# Patient Record
Sex: Female | Born: 1965
Health system: Southern US, Community
[De-identification: ages and names within clinical notes are randomized; demographics above are authoritative.]

## PROBLEM LIST (undated history)

## (undated) DIAGNOSIS — I10 Essential (primary) hypertension: Secondary | ICD-10-CM

## (undated) DIAGNOSIS — I471 Supraventricular tachycardia, unspecified: Secondary | ICD-10-CM

## (undated) DIAGNOSIS — D649 Anemia, unspecified: Secondary | ICD-10-CM

## (undated) DIAGNOSIS — M8430XA Stress fracture, unspecified site, initial encounter for fracture: Secondary | ICD-10-CM

## (undated) DIAGNOSIS — M1990 Unspecified osteoarthritis, unspecified site: Secondary | ICD-10-CM

## (undated) DIAGNOSIS — E669 Obesity, unspecified: Secondary | ICD-10-CM

## (undated) DIAGNOSIS — N939 Abnormal uterine and vaginal bleeding, unspecified: Secondary | ICD-10-CM

## (undated) DIAGNOSIS — F17200 Nicotine dependence, unspecified, uncomplicated: Secondary | ICD-10-CM

## (undated) HISTORY — PX: KNEE SURGERY: SHX244

## (undated) HISTORY — DX: Anemia, unspecified: D64.9

## (undated) HISTORY — PX: OVARIAN CYST SURGERY: SHX726

## (undated) HISTORY — PX: LEFT OOPHORECTOMY: SHX1961

## (undated) HISTORY — PX: TUBAL LIGATION: SHX77

## (undated) HISTORY — DX: Nicotine dependence, unspecified, uncomplicated: F17.200

## (undated) HISTORY — DX: Supraventricular tachycardia, unspecified: I47.10

## (undated) HISTORY — PX: SALPINGECTOMY: SHX328

## (undated) HISTORY — DX: Stress fracture, unspecified site, initial encounter for fracture: M84.30XA

## (undated) HISTORY — PX: APPENDECTOMY: SHX54

## (undated) HISTORY — DX: Unspecified osteoarthritis, unspecified site: M19.90

## (undated) HISTORY — PX: OTHER SURGICAL HISTORY: SHX169

## (undated) HISTORY — DX: Essential (primary) hypertension: I10

## (undated) HISTORY — DX: Abnormal uterine and vaginal bleeding, unspecified: N93.9

## (undated) HISTORY — PX: HYSTEROSCOPY: SHX211

## (undated) HISTORY — DX: Obesity, unspecified: E66.9

## (undated) HISTORY — PX: DERMOID CYST  EXCISION: SHX1452

## (undated) HISTORY — DX: Supraventricular tachycardia: I47.1

---

## 1998-02-16 ENCOUNTER — Ambulatory Visit (HOSPITAL_COMMUNITY): Admission: RE | Admit: 1998-02-16 | Discharge: 1998-02-16 | Payer: Self-pay | Admitting: Obstetrics and Gynecology

## 2001-11-04 ENCOUNTER — Ambulatory Visit (HOSPITAL_BASED_OUTPATIENT_CLINIC_OR_DEPARTMENT_OTHER): Admission: RE | Admit: 2001-11-04 | Discharge: 2001-11-04 | Payer: Self-pay | Admitting: Obstetrics and Gynecology

## 2003-11-28 ENCOUNTER — Ambulatory Visit (HOSPITAL_BASED_OUTPATIENT_CLINIC_OR_DEPARTMENT_OTHER): Admission: RE | Admit: 2003-11-28 | Discharge: 2003-11-28 | Payer: Self-pay | Admitting: Surgery

## 2003-11-28 ENCOUNTER — Ambulatory Visit (HOSPITAL_COMMUNITY): Admission: RE | Admit: 2003-11-28 | Discharge: 2003-11-28 | Payer: Self-pay | Admitting: Surgery

## 2003-11-28 ENCOUNTER — Encounter (INDEPENDENT_AMBULATORY_CARE_PROVIDER_SITE_OTHER): Payer: Self-pay | Admitting: *Deleted

## 2004-03-16 ENCOUNTER — Emergency Department (HOSPITAL_COMMUNITY): Admission: EM | Admit: 2004-03-16 | Discharge: 2004-03-16 | Payer: Self-pay | Admitting: Family Medicine

## 2004-06-26 ENCOUNTER — Ambulatory Visit: Payer: Self-pay | Admitting: Family Medicine

## 2004-12-27 ENCOUNTER — Ambulatory Visit: Payer: Self-pay | Admitting: Family Medicine

## 2005-03-24 ENCOUNTER — Ambulatory Visit: Payer: Self-pay | Admitting: Family Medicine

## 2005-04-08 ENCOUNTER — Ambulatory Visit: Payer: Self-pay | Admitting: Family Medicine

## 2005-06-10 ENCOUNTER — Ambulatory Visit: Payer: Self-pay | Admitting: Family Medicine

## 2005-08-19 ENCOUNTER — Ambulatory Visit: Payer: Self-pay | Admitting: Internal Medicine

## 2005-08-26 ENCOUNTER — Ambulatory Visit (HOSPITAL_COMMUNITY): Admission: RE | Admit: 2005-08-26 | Discharge: 2005-08-26 | Payer: Self-pay | Admitting: Family Medicine

## 2005-08-27 ENCOUNTER — Ambulatory Visit: Payer: Self-pay | Admitting: Internal Medicine

## 2005-09-22 ENCOUNTER — Ambulatory Visit: Payer: Self-pay | Admitting: Internal Medicine

## 2006-05-04 ENCOUNTER — Ambulatory Visit: Payer: Self-pay | Admitting: Family Medicine

## 2006-12-01 ENCOUNTER — Ambulatory Visit: Payer: Self-pay | Admitting: Family Medicine

## 2007-03-01 ENCOUNTER — Telehealth (INDEPENDENT_AMBULATORY_CARE_PROVIDER_SITE_OTHER): Payer: Self-pay | Admitting: *Deleted

## 2007-03-02 ENCOUNTER — Emergency Department (HOSPITAL_COMMUNITY): Admission: EM | Admit: 2007-03-02 | Discharge: 2007-03-02 | Payer: Self-pay | Admitting: Emergency Medicine

## 2007-05-24 ENCOUNTER — Encounter (INDEPENDENT_AMBULATORY_CARE_PROVIDER_SITE_OTHER): Payer: Self-pay | Admitting: Internal Medicine

## 2007-08-25 ENCOUNTER — Ambulatory Visit: Payer: Self-pay | Admitting: Family Medicine

## 2007-08-25 DIAGNOSIS — F17200 Nicotine dependence, unspecified, uncomplicated: Secondary | ICD-10-CM | POA: Insufficient documentation

## 2008-01-27 ENCOUNTER — Encounter: Admission: RE | Admit: 2008-01-27 | Discharge: 2008-01-27 | Payer: Self-pay | Admitting: Orthopaedic Surgery

## 2008-04-26 ENCOUNTER — Encounter: Payer: Self-pay | Admitting: Family Medicine

## 2008-08-15 ENCOUNTER — Encounter (INDEPENDENT_AMBULATORY_CARE_PROVIDER_SITE_OTHER): Payer: Self-pay | Admitting: Internal Medicine

## 2008-08-25 ENCOUNTER — Encounter (INDEPENDENT_AMBULATORY_CARE_PROVIDER_SITE_OTHER): Payer: Self-pay | Admitting: *Deleted

## 2008-09-25 ENCOUNTER — Ambulatory Visit: Payer: Self-pay | Admitting: Family Medicine

## 2008-09-25 DIAGNOSIS — E669 Obesity, unspecified: Secondary | ICD-10-CM | POA: Insufficient documentation

## 2008-09-25 DIAGNOSIS — D509 Iron deficiency anemia, unspecified: Secondary | ICD-10-CM | POA: Insufficient documentation

## 2008-09-25 DIAGNOSIS — E559 Vitamin D deficiency, unspecified: Secondary | ICD-10-CM | POA: Insufficient documentation

## 2008-10-30 ENCOUNTER — Ambulatory Visit: Payer: Self-pay | Admitting: Family Medicine

## 2008-10-30 DIAGNOSIS — J069 Acute upper respiratory infection, unspecified: Secondary | ICD-10-CM | POA: Insufficient documentation

## 2008-11-20 ENCOUNTER — Ambulatory Visit: Payer: Self-pay | Admitting: Family Medicine

## 2008-11-28 LAB — CONVERTED CEMR LAB
Basophils Relative: 0.7 % (ref 0.0–3.0)
Eosinophils Absolute: 0.2 10*3/uL (ref 0.0–0.7)
HCT: 37.8 % (ref 36.0–46.0)
Hemoglobin: 12.9 g/dL (ref 12.0–15.0)
Lymphs Abs: 3.1 10*3/uL (ref 0.7–4.0)
MCHC: 34.1 g/dL (ref 30.0–36.0)
MCV: 89.7 fL (ref 78.0–100.0)
Monocytes Absolute: 0.5 10*3/uL (ref 0.1–1.0)
Neutro Abs: 5.8 10*3/uL (ref 1.4–7.7)
RBC: 4.21 M/uL (ref 3.87–5.11)

## 2009-04-05 ENCOUNTER — Ambulatory Visit: Payer: Self-pay | Admitting: Family Medicine

## 2009-04-05 DIAGNOSIS — H60399 Other infective otitis externa, unspecified ear: Secondary | ICD-10-CM | POA: Insufficient documentation

## 2009-04-08 ENCOUNTER — Telehealth: Payer: Self-pay | Admitting: Internal Medicine

## 2009-04-11 ENCOUNTER — Ambulatory Visit: Payer: Self-pay | Admitting: Family Medicine

## 2009-04-11 DIAGNOSIS — H6590 Unspecified nonsuppurative otitis media, unspecified ear: Secondary | ICD-10-CM | POA: Insufficient documentation

## 2009-05-15 ENCOUNTER — Ambulatory Visit: Payer: Self-pay | Admitting: Family Medicine

## 2009-05-15 DIAGNOSIS — F4321 Adjustment disorder with depressed mood: Secondary | ICD-10-CM | POA: Insufficient documentation

## 2009-06-12 ENCOUNTER — Telehealth (INDEPENDENT_AMBULATORY_CARE_PROVIDER_SITE_OTHER): Payer: Self-pay | Admitting: Internal Medicine

## 2009-08-23 ENCOUNTER — Ambulatory Visit: Payer: Self-pay | Admitting: Family Medicine

## 2009-09-10 ENCOUNTER — Encounter: Payer: Self-pay | Admitting: Family Medicine

## 2009-09-18 ENCOUNTER — Encounter: Payer: Self-pay | Admitting: Family Medicine

## 2009-09-18 LAB — HM MAMMOGRAPHY

## 2009-09-19 ENCOUNTER — Ambulatory Visit: Payer: Self-pay | Admitting: Family Medicine

## 2009-09-19 LAB — CONVERTED CEMR LAB
ALT: <8 units/L (ref 0–35)
CO2: 23 meq/L (ref 19–32)
Cholesterol: 178 mg/dL (ref 0–200)
Creatinine, Ser: 0.71 mg/dL (ref 0.40–1.20)
HCT: 39.5 % (ref 36.0–46.0)
MCHC: 32.2 g/dL (ref 30.0–36.0)
MCV: 87.2 fL (ref 78.0–100.0)
Platelets: 428 10*3/uL — ABNORMAL HIGH (ref 150–400)
RDW: 13.8 % (ref 11.5–15.5)
Total Bilirubin: 0.3 mg/dL (ref 0.3–1.2)
Total CHOL/HDL Ratio: 4
Triglycerides: 86 mg/dL (ref <150)
VLDL: 17 mg/dL (ref 0–40)
Vit D, 25-Hydroxy: 27 ng/mL — ABNORMAL LOW (ref 30–89)
WBC: 8.1 10*3/uL (ref 4.0–10.5)

## 2009-09-21 ENCOUNTER — Ambulatory Visit: Payer: Self-pay | Admitting: Internal Medicine

## 2009-09-27 ENCOUNTER — Ambulatory Visit (HOSPITAL_COMMUNITY): Admission: RE | Admit: 2009-09-27 | Discharge: 2009-09-27 | Payer: Self-pay | Admitting: Obstetrics & Gynecology

## 2009-10-11 ENCOUNTER — Ambulatory Visit: Payer: Self-pay | Admitting: Family Medicine

## 2009-10-18 ENCOUNTER — Ambulatory Visit: Payer: Self-pay | Admitting: Family Medicine

## 2009-10-30 ENCOUNTER — Ambulatory Visit: Payer: Self-pay | Admitting: Family Medicine

## 2010-01-23 ENCOUNTER — Ambulatory Visit: Payer: Self-pay | Admitting: Family Medicine

## 2010-02-08 ENCOUNTER — Ambulatory Visit (HOSPITAL_COMMUNITY): Admission: RE | Admit: 2010-02-08 | Discharge: 2010-02-08 | Payer: Self-pay | Admitting: Unknown Physician Specialty

## 2010-02-20 ENCOUNTER — Ambulatory Visit: Payer: Self-pay | Admitting: Family Medicine

## 2010-03-04 ENCOUNTER — Ambulatory Visit: Payer: Self-pay | Admitting: Family Medicine

## 2010-03-04 DIAGNOSIS — I10 Essential (primary) hypertension: Secondary | ICD-10-CM | POA: Insufficient documentation

## 2010-03-04 DIAGNOSIS — R252 Cramp and spasm: Secondary | ICD-10-CM | POA: Insufficient documentation

## 2010-03-06 ENCOUNTER — Telehealth (INDEPENDENT_AMBULATORY_CARE_PROVIDER_SITE_OTHER): Payer: Self-pay | Admitting: *Deleted

## 2010-03-06 LAB — CONVERTED CEMR LAB
CO2: 30 meq/L (ref 19–32)
Calcium: 10 mg/dL (ref 8.4–10.5)
Creatinine, Ser: 0.7 mg/dL (ref 0.4–1.2)
Glucose, Bld: 102 mg/dL — ABNORMAL HIGH (ref 70–99)

## 2010-03-12 ENCOUNTER — Telehealth: Payer: Self-pay | Admitting: Family Medicine

## 2010-03-14 ENCOUNTER — Ambulatory Visit: Payer: Self-pay | Admitting: Family Medicine

## 2010-04-08 ENCOUNTER — Ambulatory Visit: Payer: Self-pay | Admitting: Family Medicine

## 2010-04-08 ENCOUNTER — Ambulatory Visit (HOSPITAL_COMMUNITY): Admission: RE | Admit: 2010-04-08 | Discharge: 2010-04-08 | Payer: Self-pay | Admitting: Family Medicine

## 2010-04-30 ENCOUNTER — Ambulatory Visit: Payer: Self-pay | Admitting: Family Medicine

## 2010-08-29 ENCOUNTER — Ambulatory Visit
Admission: RE | Admit: 2010-08-29 | Discharge: 2010-08-29 | Payer: Self-pay | Source: Home / Self Care | Attending: Family Medicine | Admitting: Family Medicine

## 2010-08-29 ENCOUNTER — Other Ambulatory Visit: Payer: Self-pay | Admitting: Family Medicine

## 2010-08-29 LAB — LIPID PANEL
Cholesterol: 173 mg/dL (ref 0–200)
HDL: 34.1 mg/dL — ABNORMAL LOW (ref >39.00)
LDL Cholesterol: 117 mg/dL — ABNORMAL HIGH (ref 0–99)
Total CHOL/HDL Ratio: 5
Triglycerides: 109 mg/dL (ref 0.0–149.0)
VLDL: 21.8 mg/dL (ref 0.0–40.0)

## 2010-08-29 LAB — BASIC METABOLIC PANEL
BUN: 9 mg/dL (ref 6–23)
CO2: 27 mEq/L (ref 19–32)
Calcium: 9.4 mg/dL (ref 8.4–10.5)
Chloride: 100 mEq/L (ref 96–112)
Creatinine, Ser: 0.8 mg/dL (ref 0.4–1.2)
GFR: 86.79 mL/min (ref >60.00)
Glucose, Bld: 86 mg/dL (ref 70–99)
Potassium: 4.2 mEq/L (ref 3.5–5.1)
Sodium: 133 mEq/L — ABNORMAL LOW (ref 135–145)

## 2010-09-12 NOTE — Assessment & Plan Note (Signed)
Summary: PAIN IN LEFT LEG   Vital Signs:  Patient profile:   45 year old female Height:      66 inches Weight:      216 pounds BMI:     34.99 Temp:     97.6 degrees F oral Pulse rate:   76 / minute Pulse rhythm:   regular BP sitting:   140 / 100  (left arm) Cuff size:   large  Vitals Entered By: Linde Gillis CMA Duncan Dull) (March 04, 2010 4:03 PM) CC: left leg cramping   History of Present Illness: 45 yo here for left leg cramping x 2 days.  Started in her left thigh, now intermittent in her left calf. No tenderness or warmth in her leg.  Has been out in the sun and heat more but has an indoor desk job during the day.  Elevated blood pressure- has been slowly increasing over last several months.  Has gained weight and had increased stress since husband was laid off.  No HA, blurred vision, or CP. Never been treated for HTN in past. Both mom and dad have HTN.  Current Medications (verified): 1)  Hydrochlorothiazide 12.5 Mg  Tabs (Hydrochlorothiazide) .... Take 1 Tab  By Mouth Every Morning  Allergies: 1)  ! Codeine 2)  ! Latex Exam Gloves (Disposable Gloves)  Past History:  Past Medical History: Last updated: 10/30/2008 Obesity Anemia Stress Fractures Vit D deficiency SVT  smoker  Review of Systems      See HPI General:  Denies malaise. Eyes:  Denies blurring. CV:  Denies chest pain or discomfort. Resp:  Denies shortness of breath. MS:  Denies muscle weakness.  Physical Exam  General:  alert.  NAD Extremities:  left lower extremity: no palpable cord, no erythema or increased warmth.  FROM. Psych:  normal affect, talkative and pleasant    Impression & Recommendations:  Problem # 1:  HYPERTENSION (ICD-401.9) Assessment New Will start HCTZ 12.5 mg and have her follow up in one month with myself or her PMD. aDvised NOT starting it until we call with her BMET results. Her updated medication list for this problem includes:    Hydrochlorothiazide 12.5 Mg Tabs  (Hydrochlorothiazide) .Marland Kitchen... Take 1 tab  by mouth every morning  Orders: Venipuncture (81191) TLB-BMP (Basic Metabolic Panel-BMET) (80048-METABOL)  Problem # 2:  LEG CRAMPS (ICD-729.82) Assessment: New Appears to be slowly improving, likely due to electrolyte imbalances from dehydration.  Discussed warning signs of DVT. Placing on a diuretic for HTN so discussed importance of staying hydrated and eating Potassium rich foods.  Complete Medication List: 1)  Hydrochlorothiazide 12.5 Mg Tabs (Hydrochlorothiazide) .... Take 1 tab  by mouth every morning  Patient Instructions: 1)  Please follow up with me in one month. 2)  Do not start blood pressure medication until we call you. 3)  Let me know if cramping worsens or changes. Prescriptions: HYDROCHLOROTHIAZIDE 12.5 MG  TABS (HYDROCHLOROTHIAZIDE) Take 1 tab  by mouth every morning  #90 x 0   Entered and Authorized by:   Ruthe Mannan MD   Signed by:   Ruthe Mannan MD on 03/04/2010   Method used:   [facility]  [address] [id number]       [address]       [place], [place]  [address]       Ph: [phone]       Fax:  [phone]   RxID:   [id number]   Prior Medications (reviewed today): None Current Allergies (reviewed today): ! CODEINE ! LATEX EXAM GLOVES (DISPOSABLE GLOVES)

## 2010-09-12 NOTE — Letter (Signed)
Summary: Results Follow up Letter  [place] [facility]  [address] [place], [place] [address]   Phone: [phone]  Fax: [phone]    09/18/2009 MRN: [id number]  [name] [address] [place], [place]  [address]  Dear [name],  The following are the results of your recent test(s):  Test         Result    Pap Smear:        Normal _____  Not Normal _____ Comments: ______________________________________________________ Cholesterol: LDL(Bad cholesterol):         Your goal is less than:         HDL (Good cholesterol):       Your goal is more than: Comments:  ______________________________________________________ Mammogram:        Normal __X___  Not Normal _____ Comments: Please repeat in one year.    ___________________________________________________________________ Hemoccult:        Normal _____  Not normal _______ Comments:    _____________________________________________________________________ Other Tests:    We routinely do not discuss normal results over the telephone.  If you desire a copy of the results, or you have any questions about this information we can discuss them at your next office visit.   Sincerely,        Hannah Beat, MD

## 2010-09-12 NOTE — Progress Notes (Signed)
Summary: heart rate is up  Phone Note Call from Patient Call back at [phone]   Caller: Patient Call For: Ruthe Mannan MD Summary of Call: Pt states she started hctz last week.  She thinks this is helping her BP but her heart rate has increased.  Her pulse is usually in the 80's but now in the 90's, was 110 last night, at rest. Please advise. Initial call taken by: Lowella Petties CMA,  March 12, 2010 9:30 AM  Follow-up for Phone Call        She's on a low dose and the only reason it should elevate her BP is if she is dehydrate or BP is too low.  Please have her make appt to be evaluated. Ruthe Mannan MD  March 12, 2010 9:33 AM  Patient advised as instructed, appt made for 03/15/2010 at 2:45.  Advised patient that is she developled SOB or her symptoms worsen go to the ER.  Follow-up by: Linde Gillis CMA Duncan Dull),  March 12, 2010 9:58 AM

## 2010-09-12 NOTE — Assessment & Plan Note (Signed)
Summary: BLOOD PRESSURE CONCERNS/NT   Vital Signs:  Patient profile:   45 year old female Height:      66 inches Weight:      216.38 pounds BMI:     35.05 Temp:     97.9 degrees F oral Pulse rate:   76 / minute Pulse rhythm:   regular BP sitting:   126 / 82  (left arm) Cuff size:   large  Vitals Entered By: Linde Gillis CMA Duncan Dull) (March 14, 2010 12:19 PM) CC: blood pressure concerns   History of Present Illness: Pt here for follow up BP, blood pressure concerns.  Started HCTZ 12.5 mg last week.  No side effects [facility] and her pulse was in the 90s and 100s.  Did not feel dizzy or like she was having palpitations.  No CP or SOB.   Bought a home BP meter and brings it in with her today.    BP on our machine in office is 126/82, pulse 76 Her home metere: 137/95, P 73  Current Medications (verified): 1)  Hydrochlorothiazide 12.5 Mg  Tabs (Hydrochlorothiazide) .... Take 1 Tab  By Mouth Every Morning 2)  Vitamin D (Ergocalciferol) 50000 Unit Caps (Ergocalciferol) .... Take One Tablet By Mouth Once A Week  Allergies: 1)  ! Codeine 2)  ! Latex Exam Gloves (Disposable Gloves)  Review of Systems      See HPI General:  Denies malaise. Eyes:  Denies blurring. CV:  Denies chest pain or discomfort. Resp:  Denies shortness of breath.  Physical Exam  General:  alert.  NAD VSS Lungs:  Normal respiratory effort, chest expands symmetrically. Lungs are clear to auscultation, no crackles or wheezes. Heart:  Normal rate and regular rhythm. S1 and S2 normal without gallop, murmur, click, rub or other extra sounds. Extremities:  no edema Psych:  normal affect, talkative and pleasant    Impression & Recommendations:  Problem # 1:  HYPERTENSION (ICD-401.9) Assessment Improved Reassurance provided, [facility] machine was likely inaccurate. Continue HCTZ at current dose, follow up at previously scheduled appt. Her updated medication list for this problem  includes:    Hydrochlorothiazide 12.5 Mg Tabs (Hydrochlorothiazide) .Marland Kitchen... Take 1 tab  by mouth every morning  Complete Medication List: 1)  Hydrochlorothiazide 12.5 Mg Tabs (Hydrochlorothiazide) .... Take 1 tab  by mouth every morning 2)  Vitamin D (ergocalciferol) 50000 Unit Caps (Ergocalciferol) .... Take one tablet by mouth once a week  Current Allergies (reviewed today): ! CODEINE ! LATEX EXAM GLOVES (DISPOSABLE GLOVES)

## 2010-09-12 NOTE — Assessment & Plan Note (Signed)
Summary: 11:30 CONGESTION,COUGH/CLE   Vital Signs:  Patient profile:   45 year old female Height:      66 inches Weight:      216.2 pounds BMI:     35.02 Temp:     98.0 degrees F oral Pulse rate:   80 / minute Pulse rhythm:   regular BP sitting:   110 / 80  (left arm) Cuff size:   regular  Vitals Entered By: Benny Lennert CMA Duncan Dull) (August 23, 2009 11:52 AM)  History of Present Illness: Chief complaint congestion and cough since saturday  44 year old female:    Acute Visit History:      The patient complains of cough, nasal discharge, sinus problems, and sore throat.  These symptoms began 5 days ago.  She denies abdominal pain, chest pain, constipation, earache, fever, headache, musculoskeletal symptoms, and nausea.        There is no history of wheezing, sleep interference, shortness of breath, respiratory retractions, tachypnea, cyanosis, or interference with oral intake associated with her cough.        'Cold' or URI symptoms have been present with the sore throat.  There is no history of dysphagia, drooling, or recent exposure to strep.        She complains of nasal congestion and purulent drainage.        Allergies: 1)  ! Codeine 2)  ! Latex Exam Gloves (Disposable Gloves)  Past History:  Past medical, surgical, family and social histories (including risk factors) reviewed, and no changes noted (except as noted below).  Past Medical History: Reviewed history from 10/30/2008 and no changes required. Obesity Anemia Stress Fractures Vit D deficiency SVT  smoker  Family History: Reviewed history and no changes required.  Social History: Reviewed history and no changes required.  Review of Systems       REVIEW OF SYSTEMS GEN: Acute illness details above. CV: No chest pain or SOB GI: No noted N or V Otherwise, pertinent positives and negatives are noted in the HPI.   Physical Exam  Additional Exam:  GEN: WDWN, NAD; alert,appropriate and cooperative  throughout exam HEENT: Normocephalic and atraumatic. Throat clear, w/o exudate, no LAD, R TM clear, L TM - good landmarks, No fluid present. rhinnorhea.  Left frontal and maxillary sinuses: NT Right frontal and maxillary sinuses: NT NECK: No ant or post LAD CV: RRR, No M/G/R PULM: no resp distress, no accessory muscles.  No retractions. no w/c/r ABD: S,NT,ND,+BS, No HSM EXTR: no c/c/e PSYCH: full affect, pleasant, conversant    Impression & Recommendations:  Problem # 1:  URI (ICD-465.9) I discussed upper respiratory tract infections with the patient and explained viral infections in general.  Recommended sleep, rest, no tobacco, no alcohol, no other drugs. Symptomatic care with pushing fluids. Symptomatic care with over-the-counter expectorant, such as Mucinex DM or Robitussin-DM, including a cough suppressant. Oral acetaminophen or NSAIDs as tolerated for body aches, chills, fevers.  follow-up if acutely worsens   We discussed and since Billie's retirement, she will follow-up with me as PCP.  Her updated medication list for this problem includes:    Benadryl 25 Mg Caps (Diphenhydramine hcl) ..... One at bedtime  Complete Medication List: 1)  Bl Multiple Vitamins Tabs (Multiple vitamins-minerals) .... Take 1 tablet by mouth once a day 2)  Vitamin D3 2000 Unit Caps (Cholecalciferol) .... Otc as directed. 3)  Calcium-magnesium 500-250 Mg Tabs (Calcium-magnesium) .... Otc as directed. 4)  Vitamin C 1000 Mg Tabs (Ascorbic acid) .Marland KitchenMarland KitchenMarland Kitchen  Otc as directed. 5)  Benadryl 25 Mg Caps (Diphenhydramine hcl) .... One at bedtime 6)  Fish Oil Oil (Fish oil) .... Otc as directed. 7)  Ferritin 5mg   .... Otc as directed.  Patient Instructions: 1)  AFRIN nasal spray 2)  Sudafed as needed  3)  Vit d - 2000 units a day  Current Allergies (reviewed today): ! CODEINE ! LATEX EXAM GLOVES (DISPOSABLE GLOVES)

## 2010-09-12 NOTE — Assessment & Plan Note (Signed)
Summary: EARACHE   Vital Signs:  Patient profile:   45 year old female Height:      66 inches Weight:      218.8 pounds BMI:     35.44 Temp:     98.2 degrees F oral Pulse rate:   80 / minute Pulse rhythm:   regular BP sitting:   130 / 84  (left arm) Cuff size:   regular  Vitals Entered By: Mervin Hack CMA Duncan Dull) (September 21, 2009 4:59 PM) CC: left ear pain   History of Present Illness: got over the cold from last month  Left ear pain since about 7 days ago thought it might be swimmer's ear and tried left over antibiotic/steroid drop didn't help  No swimmer no trauma or foreign body  Slight dry allergy cough not sick no sig nasal symptoms  No fever  Allergies: 1)  ! Codeine 2)  ! Latex Exam Gloves (Disposable Gloves)  Past History:  Past medical, surgical, family and social histories (including risk factors) reviewed for relevance to current acute and chronic problems.  Past Medical History: Reviewed history from 10/30/2008 and no changes required. Obesity Anemia Stress Fractures Vit D deficiency SVT  smoker  Family History: Reviewed history and no changes required.  Social History: Reviewed history and no changes required.  Review of Systems       no vertigo notes some tinnitus hearing is okay  Physical Exam  General:  alert.  NAD Ears:  sig left pinna tenderness Inflammation noted inside of pinna and sig in canal deep cerumen but TM doesn't appear involved  right ear normal Nose:  no sig inflammation Mouth:  no erythema and no exudates.     Impression & Recommendations:  Problem # 1:  OTITIS EXTERNA (ICD-380.10) Assessment Comment Only  no better with 2 days of cortisporin otic drops  will try oral cipro cipro/HC  drops ibuprofen for pain  Her updated medication list for this problem includes:    Cipro Hc 0.2-1 % Susp (Ciprofloxacin-hydrocortisone) .Marland KitchenMarland KitchenMarland KitchenMarland Kitchen 4 drops in left ear 4 times daily for 5 days  Complete Medication  List: 1)  Cipro Hc 0.2-1 % Susp (Ciprofloxacin-hydrocortisone) .... 4 drops in left ear 4 times daily for 5 days 2)  Ciprofloxacin Hcl 250 Mg Tabs (Ciprofloxacin hcl) .Marland Kitchen.. 1 tab by mouth two times a day for outer ear infeciton  Patient Instructions: 1)  Please schedule a follow-up appointment as needed .  Prescriptions: CIPROFLOXACIN HCL 250 MG TABS (CIPROFLOXACIN HCL) 1 tab by mouth two times a day for outer ear infeciton  #14 x 0   Entered and Authorized by:   Cindee Salt MD   Signed by:   Cindee Salt MD on 09/21/2009   Method used:   [facility]  [address] [phone]       [address]       [place], [place]  [address]       Ph: [phone]       Fax: [phone]   RxID:   [id number] CIPRO HC 0.2-1 % SUSP (CIPROFLOXACIN-HYDROCORTISONE) 4 drops in left ear 4 times daily for 5 days  #1 bottle x 1   Entered and Authorized by:   Cindee Salt MD   Signed by:   Cindee Salt MD on 09/21/2009   Method used:   [facility]  [address] [id number]       [address]       [place], [place]  [address]       Ph: [phone]       Fax: [phone]   RxID:   [id number]   Prior Medications (reviewed today): None Current Allergies (reviewed today): ! CODEINE ! LATEX EXAM GLOVES (DISPOSABLE GLOVES)

## 2010-09-12 NOTE — Progress Notes (Signed)
Summary: Transfer care to Dr. Dayton Martes  Phone Note Call from Patient Call back at Work Phone [phone]   Caller: Patient Call For: Hannah Beat MD Summary of Call: Patient saw Dr. Dayton Martes on 03/04/2010 for foot pain and would like to transfer her care to Dr. Dayton Martes.  Patient is aware that both Dr. Patsy Lager and Dr. Dayton Martes have to agree that this would be ok.  Please advise. Initial call taken by: Linde Gillis CMA Duncan Dull),  March 06, 2010 9:00 AM  Follow-up for Phone Call        I believe this is one of Billie's patients that I only saw once. Perfectly fine. Follow-up by: Hannah Beat MD,  March 06, 2010 9:13 AM  Additional Follow-up for Phone Call Additional follow up Details #1::        Ok with me. Ruthe Mannan MD  March 06, 2010 9:14 AM     Additional Follow-up for Phone Call Additional follow up Details #2::    I notified pt. she can switch to Dr.Aron. Follow-up by: Beau Fanny,  March 06, 2010 9:25 AM

## 2010-09-12 NOTE — Letter (Signed)
Summary: Out of Work  [facility]  [address] [place], [place] [address]   Phone: [phone]  Fax: [phone]    August 23, 2009   Employee:  HADJA HARRAL    To Whom It May Concern:   For Medical reasons, please excuse the above named employee from work for the following dates:  Start:   08/22/2009  End:   08/24/2009  If you need additional information, please feel free to contact our office.         Sincerely,    Hannah Beat MD

## 2010-09-12 NOTE — Assessment & Plan Note (Signed)
Summary: F/U FOR FLUID PILLS / LFW   Vital Signs:  Patient profile:   45 year old female Height:      66 inches Weight:      217 pounds BMI:     35.15 Temp:     97.9 degrees F oral Pulse rate:   86 / minute Pulse rhythm:   regular BP sitting:   120 / 82  (right arm) Cuff size:   large  Vitals Entered By: Linde Gillis CMA Duncan Dull) (August 29, 2010 8:54 AM) CC: follow up from being on fluid pills   History of Present Illness: Pt here for follow up BP, blood pressure concerns.  Started HCTZ 12.5 mg in August.  No side effects.  No CP, SOB, blurred vision.    No Le edema. Has not had labs checked since last year.  Current Medications (verified): 1)  Hydrochlorothiazide 12.5 Mg  Tabs (Hydrochlorothiazide) .... Take 1 Tab  By Mouth Every Morning  Allergies: 1)  ! Codeine 2)  ! Latex Exam Gloves (Disposable Gloves)  Past History:  Past Medical History: Last updated: 10/30/2008 Obesity Anemia Stress Fractures Vit D deficiency SVT  smoker  Review of Systems      See HPI General:  Denies malaise. Eyes:  Denies blurring. CV:  Denies chest pain or discomfort and shortness of breath with exertion. Resp:  Denies shortness of breath.  Physical Exam  General:  alert.  NAD VSS Lungs:  Normal respiratory effort, chest expands symmetrically. Lungs are clear to auscultation, no crackles or wheezes. Heart:  Normal rate and regular rhythm. S1 and S2 normal without gallop, murmur, click, rub or other extra sounds. Extremities:  no edema Neurologic:  alert & oriented X3 and gait normal.   Skin:  Intact without suspicious lesions or rashes Psych:  normal affect, talkative and pleasant    Impression & Recommendations:  Problem # 1:  HYPERTENSION (ICD-401.9) Assessment Unchanged Stable.  Well controlled on low dose HCTZ. Recheck BMET today. Her updated medication list for this problem includes:    Hydrochlorothiazide 12.5 Mg Tabs (Hydrochlorothiazide) .Marland Kitchen... Take 1 tab  by  mouth every morning  Orders: Venipuncture (14782) TLB-BMP (Basic Metabolic Panel-BMET) (80048-METABOL)  Complete Medication List: 1)  Hydrochlorothiazide 12.5 Mg Tabs (Hydrochlorothiazide) .... Take 1 tab  by mouth every morning  Other Orders: TLB-Lipid Panel (80061-LIPID) Prescriptions: HYDROCHLOROTHIAZIDE 12.5 MG  TABS (HYDROCHLOROTHIAZIDE) Take 1 tab  by mouth every morning  #90 Capsule x 3   Entered and Authorized by:   Ruthe Mannan MD   Signed by:   Ruthe Mannan MD on 08/29/2010   Method used:   [facility]  [address] [id number]       [address]       [place], [place]  [address]       Ph: [phone]       Fax: [phone]   RxID:   [id number]    Orders Added: 1)  Venipuncture [59563] 2)  TLB-Lipid Panel [80061-LIPID] 3)  TLB-BMP (Basic Metabolic Panel-BMET) [80048-METABOL] 4)  Est. Patient Level III [87564]    Current Allergies (reviewed today): ! CODEINE ! LATEX EXAM GLOVES (DISPOSABLE GLOVES)

## 2010-09-12 NOTE — Miscellaneous (Signed)
Summary: Clinical update, mammogram  Clinical Lists Changes  Observations: Added new observation of MAMMO DUE: 09/10/2010 (09/18/2009 14:14) Added new observation of MAMMOGRAM: 09/10/2009 (09/18/2009 14:14)

## 2010-10-25 LAB — CBC
Platelets: 379 10*3/uL (ref 150–400)
RDW: 15.2 % (ref 11.5–15.5)
WBC: 10.5 10*3/uL (ref 4.0–10.5)

## 2010-10-25 LAB — BASIC METABOLIC PANEL
CO2: 29 mEq/L (ref 19–32)
Calcium: 9.8 mg/dL (ref 8.4–10.5)
Creatinine, Ser: 0.71 mg/dL (ref 0.4–1.2)
GFR calc Af Amer: >60 mL/min (ref >60)

## 2010-10-25 LAB — SURGICAL PCR SCREEN: MRSA, PCR: NEGATIVE

## 2010-10-25 LAB — PREGNANCY, URINE: Preg Test, Ur: NEGATIVE

## 2010-12-18 ENCOUNTER — Ambulatory Visit: Payer: Self-pay | Admitting: Family Medicine

## 2010-12-19 ENCOUNTER — Ambulatory Visit (INDEPENDENT_AMBULATORY_CARE_PROVIDER_SITE_OTHER): Payer: 59 | Admitting: Obstetrics & Gynecology

## 2010-12-19 DIAGNOSIS — Z113 Encounter for screening for infections with a predominantly sexual mode of transmission: Secondary | ICD-10-CM

## 2010-12-19 DIAGNOSIS — Z1272 Encounter for screening for malignant neoplasm of vagina: Secondary | ICD-10-CM

## 2010-12-19 DIAGNOSIS — Z01419 Encounter for gynecological examination (general) (routine) without abnormal findings: Secondary | ICD-10-CM

## 2010-12-20 NOTE — Assessment & Plan Note (Signed)
NAMEANGELEAH, [name]                 ACCOUNT NO.:  [id number]  MEDICAL RECORD NO.:  [id number]            PATIENT TYPE:  LOCATION:  [facility]           FACILITY:  PHYSICIAN:  Jaynie Collins, MD     DATE OF BIRTH:  1966-06-26  DATE OF SERVICE:  12/19/2010                                 CLINIC NOTE  REASON FOR VISIT:  Annual examination.  [name] is a 45 year old, gravida 2, para 2, who is here today for her annual examination.  The patient underwent hydrothermal ablation, D and C, and an operative laparoscopy with right salpingectomy and right ovarian cystectomy by Dr. Shawnie Pons on August 2011 and was seen for her postoperative evaluation.  Since then, the patient does say that she got her menstrual periods up until February 2012 and since then she has not had any menstrual periods.  She is not currently sexually active and has not been for several months.  She wants to know if this is consistent with her postablative effects or if she could be going into early menopause.  The patient is also requesting thyroid hormone levels and also vitamin D level to be checked today.  Of note, her primary care physician is Dr. Ruthe Mannan, who manages the rest of her preventative health maintenance and her other medical problems.  The patient has no gynecologic concerns.  PAST OB/GYN HISTORY:  The patient has had two cesarean sections.  She used to have menorrhagia prior to the ablation and currently she is amenorrheic since February.  The patient denies any history of sexually transmitted infections and she also denies any history of any cervical dysplasia.  PAST MEDICAL HISTORY:  Supraventricular tachycardia, arthritis.  PAST SURGICAL HISTORY:  Cesarean section x2, bilateral tubal ligation, knee surgery, left salpingo-oophorectomy for a teratoma, appendectomy, D and C, hysteroscopy, hydrothermal ablation, right salpingectomy, and right ovarian cystectomy.  MEDICATIONS:  Vitamin D  daily and hydrochlorothiazide 12.5 mg daily.  ALLERGIES:  No known drug allergies.  The patient is allergic to LATEX.  SOCIAL HISTORY:  The patient has a 24-pack-year history and continues to smoke 1 pack a day.  She does not drink alcohol or do any other drugs. She works as an Environmental health practitioner for [employer].  The patient declines help in cutting down or quitting smoking.  FAMILY HISTORY:  Remarkable for an extensive history of diabetes, heart disease, hypertension.  Her grandmother had gallbladder cancer.  Her mother had blood clots and grandfather had stroke.  REVIEW OF SYSTEMS:  14-point review of systems were reviewed and was negative.  The patient did have a mammogram in February 2012 which was normal.  PHYSICAL EXAMINATION:  VITAL SIGNS:  Blood pressure 139/92, pulse 84, weight 221 pounds, height 5 feet 6-1/2 inches. GENERAL:  No apparent distress. HEENT:  Normocephalic, atraumatic. NECK:  Supple.  Normal thyroid. LUNGS:  Clear to auscultation bilaterally. HEART:  Regular rate and rhythm. BREASTS:  Symmetric in size, soft, nontender.  No abnormal masses, skin changes, nipple drainage, or lymphadenopathy. ABDOMEN:  Soft, nontender, nondistended.  No organomegaly. EXTREMITIES:  No cyanosis, clubbing, or edema. PELVIC:  Normal external female genitalia.  Pink, well-rugated vagina. Normal cervical contour.  Pap smear sample was obtained.  Uterus is retroverted.  No adnexal masses or tenderness noted on bimanual exam.  IMPRESSION:  The patient is a 45 year old gravida 2, para 2, here for yearly examination.  The patient has no gynecologic concerns.  She has been amenorrheic for 3 months.  PLAN:  We will follow up Pap smear results and HPV test was also to be drawn today, and if her Pap smear and HPV both come back negative, the patient can potentially increase the time between Pap smears.  She is going to have labs today to include a TSH, FSH, HCG, and vitamin  D level.  We will follow up digoxin results and manage accordingly.  The patient is to follow up with Dr. Ruthe Mannan for further management of her SVT and also given that her blood pressure today was 139/92 which is concerning for hypertension, and she might need her hydrochlorothiazide to be increased.  The patient was also told to call or come back in with any further gynecologic concerns.          ______________________________ Jaynie Collins, MD    UA/MEDQ  D:  12/19/2010  T:  12/19/2010  Job:  [id number]

## 2010-12-23 ENCOUNTER — Encounter: Payer: Self-pay | Admitting: Family Medicine

## 2010-12-23 ENCOUNTER — Ambulatory Visit (INDEPENDENT_AMBULATORY_CARE_PROVIDER_SITE_OTHER): Payer: 59 | Admitting: Family Medicine

## 2010-12-23 DIAGNOSIS — I10 Essential (primary) hypertension: Secondary | ICD-10-CM

## 2010-12-23 DIAGNOSIS — F4322 Adjustment disorder with anxiety: Secondary | ICD-10-CM

## 2010-12-23 MED ORDER — BUSPIRONE HCL 15 MG PO TABS: 15.0000 mg | ORAL_TABLET | Freq: Two times a day (BID) | ORAL | Status: DC

## 2010-12-23 NOTE — Patient Instructions (Signed)
IMPORTANT: HOW TO USE THIS INFORMATION:  This is a summary and does NOT have all possible information about this product. This information does not assure that this product is safe, effective, or appropriate for you. This information is not individual medical advice and does not substitute for the advice of your health care professional. Always ask your health care professional for complete information about this product and your specific health needs.    BUSPIRONE - ORAL ([id number])    COMMON BRAND NAME(S): Buspar    USES:  This medication is used to treat anxiety. It may help you think more clearly, relax, worry less, and take part in everyday life. It may also help you to feel less jittery and irritable, and may control symptoms such as trouble sleeping, sweating, and pounding heartbeat. Buspirone is a medication for anxiety (anxiolytic) that works by affecting certain natural substances in the brain (neurotransmitters).    HOW TO USE:  Take this medication by mouth, usually 2 or 3 times a day or as directed by your doctor. You may take this medication with or without food, but it is important to choose one way and always take it the same way so that the amount of drug absorbed will always be the same. Buspirone may come in a tablet that can be split to get the correct dose for you. Follow the manufacturer's Patient Instruction Sheet or ask your pharmacist how to split the tablet to get your dose. Limit the amount of grapefruit you may eat or drink (less than one quart a day) while being treated with this medication unless your doctor directs you otherwise. Grapefruit may increase the amount of buspirone in your bloodstream. Consult your pharmacist or doctor for more information. Dosage is based on your medical condition and response to therapy. Use this medication regularly in order to get the most benefit from it. To help you remember, use it at the same times each day. When this medication is  started, symptoms of anxiety (e.g., restlessness) may sometimes get worse before they improve. It may take up to a month or more to get the full effect of this medication. Inform your doctor if your symptoms persist or worsen.    SIDE EFFECTS:  Dizziness, drowsiness, headache, nausea, nervousness, lightheadedness, restlessness, blurred vision, tiredness, and trouble sleeping may occur. If any of these effects persist or worsen, notify your doctor or pharmacist promptly. Remember that your doctor has prescribed this medication because he or she has judged that the benefit to you is greater than the risk of side effects. Many people using this medication do not have serious side effects. Rarely, patients taking buspirone may develop movement disorders such as shakiness (tremors), muscle stiffness, mask-like facial expression, jerky walking movements, or a condition known as tardive dyskinesia. In some cases, these conditions may be permanent. Tell your doctor immediately if you develop any unusual/uncontrolled movements (especially of the face, mouth, tongue, arms, or legs). Seek immediate medical attention if any of these rare but serious side effects occur: easy bleeding/bruising, shortness of breath, chest pain, fast/irregular heartbeat. A very serious allergic reaction to this drug is unlikely, but seek immediate medical attention if it occurs. Symptoms of a serious allergic reaction may include: rash, itching/swelling (especially of the face/tongue/throat), severe dizziness, trouble breathing. This is not a complete list of possible side effects. If you notice other effects not listed above, contact your doctor or pharmacist . In the [place] - Call your doctor for medical advice about side  effects. You may report side effects to FDA at 1-800-FDA-1088. In [place] - Call your doctor for medical advice about side effects. You may report side effects to [facility] [place] at [phone].    PRECAUTIONS:  Before taking  buspirone, tell your doctor or pharmacist if you are allergic to it; or if you have any other allergies. This product may contain inactive ingredients, which can cause allergic reactions or other problems. Talk to your pharmacist for more details. This medication should not be used if you have certain medical conditions. Before using this medication, consult your doctor if you have: kidney problems, liver problems. Before using this medication, tell your doctor or pharmacist your medical history, especially of: bipolar disorder (manic-depression), Parkinson's disease. This drug may make you dizzy or drowsy. Do not drive, use machinery, or do any activity that requires alertness until you are sure you can perform such activities safely. Limit alcoholic beverages. If you are taking other medications for anxiety, do not suddenly stop them unless directed by your doctor. Buspirone will not prevent withdrawal symptoms from other medications, and your dose may need to be lowered slowly when you switch to buspirone. Discuss your treatment plan with your doctor. If you experience withdrawal symptoms, tell your doctor immediately. During pregnancy, this medication should only be used when clearly needed. Tell your doctor if you are pregnant before using this medication. Discuss the risks and benefits with your doctor. It is unknown if this drug passes into breast milk. However, similar drugs pass into breast milk and may have undesirable effects on a nursing infant. Consult your doctor before breastfeeding.    DRUG INTERACTIONS:  Your healthcare professionals (e.g., doctor or pharmacist) may already be aware of any possible drug interactions and may be monitoring you for it. Do not start, stop or change the dosage of any medicine before checking with them first. Buspirone should not be used with MAO inhibitors (e.g., furazolidone, isocarboxazid, linezolid, moclobemide, phenelzine, procarbazine, rasagiline, selegiline,  tranylcypromine). Do not take buspirone within 2 weeks before, during and after treatment with MAO inhibitors. In some cases, a serious, possibly fatal, drug interaction may occur. Before using this medication, tell your doctor or pharmacist of all prescription and nonprescription/herbal products you may use, especially of: alcohol, antidepressants (e.g., SSRIs such as fluoxetine, tricyclic antidepressants such as amitriptyline/nortriptyline, trazodone), benzodiazepines (e.g., lorazepam, clonazepam, diazepam), haloperidol, drugs that slow down the removal of buspirone from your body by affecting certain liver enzymes including azole antifungals (e.g., itraconazole, ketoconazole), macrolides (e.g., erythromycin), ritonavir, nefazodone, diltiazem, verapamil, drugs that speed up the removal of buspirone from your body by affecting certain liver enzymes including rifamycins (e.g., rifampin, rifabutin), corticosteroids (e.g., dexamethasone), and certain anticonvulsants (e.g., carbamazepine, phenytoin, phenobarbital). Tell your doctor or pharmacist if you also take drugs that cause drowsiness such as: certain antihistamines (e.g., diphenhydramine), anti-seizure medications (e.g., valproic acid), medicine for sleep or anxiety (e.g., alprazolam, flurazepam, zolpidem), muscle relaxants, narcotic pain relievers (e.g., codeine), psychiatric medications (e.g., risperidone). This document does not contain all possible interactions. Therefore, before using this product, tell your doctor or pharmacist of all the products you use. Keep a list of all your medications with you, and share the list with your doctor and pharmacist.    OVERDOSE:  If overdose is suspected, contact your local poison control center or emergency room immediately. [place] residents can call the [place] national poison hotline at [phone]. [place] residents should call their local poison control center directly.    NOTES:  Do not share this  medication with  others. Keep all regular medical and laboratory appointments. If you are also taking trazodone, liver function tests may be performed regularly to check for side effects. Consult your doctor for more details.    MISSED DOSE:  If you miss a dose, take it as soon as you remember. If it is near the time of the next dose, skip the missed dose and resume your usual dosing schedule. Do not double the dose to catch up.    STORAGE:  Store the [place] product in a tightly closed container at room temperature below 86 degrees F (30 degrees C) away from light and moisture. Store the [place] product in a tightly closed container at room temperature 59-86 degrees F (15-30 degrees C) away from light and moisture. Do not store in the bathroom. Keep all medicines away from children and pets. Do not flush medications down the toilet or pour them into a drain unless instructed to do so. Properly discard this product when it is expired or no longer needed. Consult your pharmacist or local waste disposal company for more details about how to safely discard your product.    Information last revised September 2010 Copyright(c) 2010 [employer].

## 2010-12-23 NOTE — Assessment & Plan Note (Signed)
Unchanged. Was likely elevated due to anxiety at GYN, was nervous about having GYN exam. Continue low dose HCTZ.

## 2010-12-23 NOTE — Progress Notes (Signed)
Pt here for:  1.  HTN- Started HCTZ 12.5 mg in August 2011.  No side effects.  No CP, SOB, blurred vision.   Was at OBGYN last week and BP was elevated to 145/92, was advised to follow up with me today.   No Le edema.  2.  Anxiety- husband laid off of work,more stressors. Feels constantly anxious, no panic attacks. Sometimes has difficultly sleeping, occasionally tearful.   NO SI or HI. NO anhedonia. Was on paxil in past, made her feel like a Zombie.  The PMH, PSH, Social History, Family History, Medications, and allergies have been reviewed in [facility], and have been updated if relevant.   Review of Systems       See HPI General:  Denies malaise. Eyes:  Denies blurring. CV:  Denies chest pain or discomfort and shortness of breath with exertion. Resp:  Denies shortness of breath.  Physical Exam BP 132/82  Pulse 87  Temp(Src) 98.3 F (36.8 C) (Oral)  Wt 219 lb 12.8 oz (99.701 kg)  General:  alert.  NAD VSS Lungs:  Normal respiratory effort, chest expands symmetrically. Lungs are clear to auscultation, no crackles or wheezes. Heart:  Normal rate and regular rhythm. S1 and S2 normal without gallop, murmur, click, rub or other extra sounds. Extremities:  no edema Neurologic:  alert & oriented X3 and gait normal.   Skin:  Intact without suspicious lesions or rashes Psych:  normal affect, talkative and pleasant

## 2010-12-23 NOTE — Assessment & Plan Note (Signed)
Deteriorated. Discussed different treatment options. Will try Buspar 15 mg twice daily. Follow up in one month.

## 2010-12-24 NOTE — Assessment & Plan Note (Signed)
NAMEUNIQUE, SILLAS               ACCOUNT NO.:  [id number]   MEDICAL RECORD NO.:  [id number]          PATIENT TYPE:  POB   LOCATION:  [facility] at [facility]         FACILITY:  [facility]   PHYSICIAN:  Tinnie Gens, MD        DATE OF BIRTH:  [birth year]   DATE OF SERVICE:  10/18/2009                                  CLINIC NOTE   CHIEF COMPLAINT:  Endometrial biopsy.   HISTORY OF PRESENT ILLNESS:  The patient is a 45 year old para 2 with  history of 2 prior cesarean sections who came in for endometrial  sampling.  She was last here a week ago and we were unable to attempt  achieve endometrial samplings.  She has come in today after taking  Cytotec.   PROCEDURE:  A speculum was placed in the vagina.  Cervix was cleaned  with Betadine x2 and grasped anteriorly with a single-tooth tenaculum.  An os finder was used and then endometrial sampling was obtained.  The  uterus sounded to approximately 8 cm and good endometrial sampling was  achieved.  The patient tolerated the procedure well.  The single-tooth  tenaculum was removed.  There was bleeding at the site.  Direct pressure  was placed on this until bleeding subsided.   IMPRESSION:  1. Ovarian cyst, likely dermoid  2. Abnormal uterine bleeding with thickened endometrium status post      endometrial sampling today.   PLAN:  Await biopsy results.  We will call results to patient.  She will  return in 2 weeks for discussion of definitive therapy.           ______________________________  Tinnie Gens, MD     TP/MEDQ  D:  10/18/2009  T:  10/19/2009  Job:  [id number]

## 2010-12-24 NOTE — Assessment & Plan Note (Signed)
[name], [name]               ACCOUNT NO.:  [id number]   MEDICAL RECORD NO.:  [id number]          PATIENT TYPE:  POB   LOCATION:  [facility] at [facility]         FACILITY:  [facility]   PHYSICIAN:  Tinnie Gens, MD        DATE OF BIRTH:  09-15-1965   DATE OF SERVICE:  10/11/2009                                  CLINIC NOTE   CHIEF COMPLAINT:  Endometrial biopsy.   HISTORY OF PRESENT ILLNESS:  The patient is a 45 year old, para 2 with 2  previous C-sections, who came in today for endometrial sampling.  She  was last here in early February with yearly exam and she was complaining  of heavy cycles.  She does have a history of right ovarian cyst.  She  has already lost her left ovary.  She came for sonography which revealed  persistent ovarian cyst that was consistent with a dermoid that was 6 x  5 x 4.5 cm.  She also had a slightly thickened endometrial stripe at 1.3  cm.  Attempt was made for endometrial sampling today.   PROCEDURE:  Cervix was cleaned with Betadine x2, grasped anteriorly with  single-tooth tenaculum.  Attempt was made to pass the sounds, which  could not be passed.  Attempt was made to pass os finder, but still  could not find passage into the endometrial cavity.  The procedure was  then aborted.   IMPRESSION:  1. Ovarian cyst.  This was discussed in length with the patient      whether we need take this out, but do you think that she try and      needs definitive diagnosis on the thickened endometrium and      bleeding.  The patient will return after taking Cytotec to see if      this biopsy can be achieved here in the office if not would need to      be scheduled for a dilation and curettage.  2. Abnormal bleeding.  3. Dermoid cyst on the right ovary.   PLAN:  More definitive therapy will be discussed once biopsy is  obtained.  The patient will return after taking Cytotec with her next  cycle for reattempted endometrial sampling.     ______________________________  Tinnie Gens, MD     TP/MEDQ  D:  10/11/2009  T:  10/12/2009  Job:  [id number]

## 2010-12-24 NOTE — Assessment & Plan Note (Signed)
[name], [name]               ACCOUNT NO.:  [id number]   MEDICAL RECORD NO.:  [id number]          PATIENT TYPE:  POB   LOCATION:  [facility]         FACILITY:  [facility]   PHYSICIAN:  Tinnie Gens, MD        DATE OF BIRTH:  08-Aug-1966   DATE OF SERVICE:  02/20/2010                                  CLINIC NOTE   CHIEF COMPLAINT:  Followup ultrasound.   HISTORY OF PRESENT ILLNESS:  The patient is a 45 year old gravida 2,  para 2 who had previously been scheduled for a D and C, hysteroscopy and  endometrial ablation with hydrothermal ablation.  The patient was  scheduled to have a followup ultrasound.  Followup ultrasound appears to  show a 2.8 cm right adnexal mass consistent with a dermoid.  The patient  also was noted to have a hydrosalpinx probably that measures  approximately 5.1 x 3.4 x 2.9 and has 3 unilocular cystic foci.  This  was seen on the previous ultrasound, certainly seems stable.  A lengthy  discussion was had with the patient.  She had previously not wanted  anything done, however I assured her I will not remove her ovary, so now  she has consented to do the laparoscopy as well.  On exam, her vitals  are as noted in the chart.  This is a well-developed, well-nourished  woman in no acute distress.  Abdomen is soft, nontender, and  nondistended.   IMPRESSION:  Probable hydrosalpinx with small right ovarian cyst in  addition to endometrial polyps and abnormal bleeding.   PLAN:  Continue with D and C, hysteroscopy, and HTA.  We will add  laparoscopic ovarian cystectomy and probable salpingectomy.           ______________________________  Tinnie Gens, MD     TP/MEDQ  D:  02/20/2010  T:  02/21/2010  Job:  [id number]

## 2010-12-24 NOTE — Assessment & Plan Note (Signed)
[name], [name]               ACCOUNT NO.:  [id number]   MEDICAL RECORD NO.:  [id number]          PATIENT TYPE:  POB   LOCATION:  [facility]         FACILITY:  [facility]   PHYSICIAN:  Tinnie Gens, MD        DATE OF BIRTH:  1965/11/17   DATE OF SERVICE:  10/30/2009                                  CLINIC NOTE   CHIEF COMPLAINT:  Followup.   HISTORY OF PRESENT ILLNESS:  The patient is a 45 year old with abnormal  uterine bleeding, who has 2 prior cesarean sections who underwent  endometrial sampling on October 18, 2009.  Her biopsy came back showing  endometrial polyp that was benign and no hyperplasia or carcinoma noted.  She was noted to have a thickened endometrial sample on pelvic sonogram.  She also was found to have a dermoid and it has probably been there for  some time.  She at least knew about that when she asked to have the  ultrasound ordered.  It was approximately 6 x 5 x 4.5.  The alternatives  and options were fully discussed with the patient today.   On exam, her vitals were as noted in the chart.  She is a well-  developed, well-nourished female, in no acute distress.   IMPRESSION:  1. Ovarian cyst, likely dermoid probably chronic.  The patient opts to      do nothing about this at this time.  We did discuss possible      torsion of the problems later in life.  However, she already lost 1      ovary and is not interested in losing another.  She is worried      about scar tissue and the need for total removal bleeding if we go      on to try to get the cyst.  2. Abnormal uterine bleeding probably secondary to the uterine polyp.   PLAN:  All options were discussed with this patient and she is going to  think about her options and said is leaning most heavily toward D and C,  polypectomy, and endometrial ablation.  The patient will call back to  discuss further or after discussion with her family, she will call back  to schedule.     ______________________________  Tinnie Gens, MD     TP/MEDQ  D:  10/30/2009  T:  10/30/2009  Job:  [id number]

## 2010-12-24 NOTE — Assessment & Plan Note (Signed)
[name], [name]               ACCOUNT NO.:  [id number]   MEDICAL RECORD NO.:  [id number]          PATIENT TYPE:  POB   LOCATION:  [facility]         [facility]ACILITY:  [facility]   PHYSICIAN:  Tinnie Gens, MD        DATE OF BIRTH:  10/23/1965   DATE OF SERVICE:  09/19/2009                                  CLINIC NOTE   CHIEF COMPLAINT:  Physical exam.   HISTORY OF PRESENT ILLNESS:  The patient is a 45 year old gravida 2,  para 2 who is seen today.  She is to get her care with Dr. Elana Alm.  She  brings her old chart from there.  The patient has a history of heavy  cycles which have gotten worse over the past several years.  She reports  bleeding through pads at night and the cycle lasts for approximately 10  days.  The patient has a history of a dermoid which was removed in 1982  and then she had her entire ovary removed with a dermoid as well in  2003.  She has a history of 2 prior cesarean sections and a tubal  ligation.  She has no history of abnormal Pap smears or STDs.   PAST MEDICAL HISTORY:  Significant for arthritis and SVT.   PAST SURGICAL HISTORY:  C-section x2, left ovarian cyst removal in 1982  and left ovary removed in 2003, arthroscopic knee surgery in 2007 and  then another knee surgery in 2009.   MEDICATIONS:  None.   ALLERGIES:  None known.   FAMILY HISTORY:  Diabetes, heart disease, hypertension, gallbladder  cancer in her grandmother, blood clots in her mother, stroke in her  grandfather.   SOCIAL HISTORY:  The patient has a 22-pack year history and continues to  smoke 1 pack per day.  Does not drink alcohol.  She does not do any  other drugs.   14-point review of system reviewed.  Please see additional history in  the chart.  Positive for fatigue, headaches, weight gain, and a problem  with her right ear which has been itchy.  She had otitis externa back in  August and she feels like she is getting that again.  The patient also  reports history of  having her vitamin D checked and that being low, she  was on replacement for long, got really good while she took that  medicine, but a recheck showed this level to be normal until they  stopped that medicine for her.  She did not have lipid panel recently,  had a mammogram in February 2011 which was normal.   PHYSICAL EXAMINATION:  VITALS SIGNS:  As noted in the chart.  GENERAL:  An obese female in no acute distress.  HEENT:  Normocephalic, atraumatic.  Sclerae anicteric.  The external  auditory canal on the right shows some swelling and yellow changes.  The  left ear is clear.  NECK:  Supple.  Normal thyroid.  LUNGS:  Clear bilaterally.  CARDIOVASCULAR:  Regular rate and rhythm.  No rubs, gallops,or murmurs.  ABDOMEN:  Soft, nontender, nondistended.  EXTREMITIES:  She has trace pedal edema.  BREASTS:  Symmetric with everted nipples.  No masses.  No  supraclavicular or axillary adenopathy.  GU:  Normal external female genitalia.  Normal vagina, pink and rugated.  Cervix is anterior and nulliparous in appearance.  Uterus is  retroverted, no semi-adnexal mass or tenderness noted.   IMPRESSION:  Yearly exam, abnormal uterine bleeding and history of  vitamin D deficiency.   PLAN:  1. Pap smear today.  2. Yearly labs to include CBC, CMP, TSH, vitamin D level and lipid      panel.  3. We will schedule ultrasound.  4. Schedule for pelvic sonogram.  We will follow up previously seen      right ovarian cyst as well as eval for heavy bleeding.  5. Return for endometrial sampling in 2 weeks and followup the      results.           ______________________________  Tinnie Gens, MD     TP/MEDQ  D:  09/19/2009  T:  09/20/2009  Job:  [id number]

## 2010-12-24 NOTE — Assessment & Plan Note (Signed)
[name], SAVICH               ACCOUNT NO.:  [id number]   MEDICAL RECORD NO.:  [id number]          PATIENT TYPE:  POB   LOCATION:  [facility] at [facility]         FACILITY:  [facility]   PHYSICIAN:  Tinnie Gens, MD        DATE OF BIRTH:  06/30/1966   DATE OF SERVICE:  01/23/2010                                  CLINIC NOTE   CHIEF COMPLAINT:  To schedule surgery.   HISTORY OF PRESENT ILLNESS:  The patient is a 45 year old gravida 2,  para 2 who has a long complicated OB/GYN history, basically has had  multiple dermoids that required initially left ovarian cystectomy  followed by left oophorectomy, 2 prior cesarean sections, who has  abnormal uterine bleeding and heavy periods that lasts 7-10 days.  She  has undergone endometrial sampling which showed a polyp.  Ultrasound was  essentially normal of the uterus.  An ultrasound showed a 6 x 5 x 4.5 cm  dermoid and discussion was held with the patient about risks of leaving  this dermoid in situ.  The patient is very worried about having go on  HRT and does not want her ovary removed.  The patient has previously  been offered D and C, hysteroscopy with further biopsy and endometrial  ablation to relieve her abnormal bleeding if she is unsure to do a  hysterectomy or more definitive therapy about that.  She comes in today  to have this scheduled and all questions and risks and benefits were  discussed with the patient.  Additionally, the patient wants to discuss  about her right ovary again.  She stated that Dr. Elana Alm, her previous  gynecologist, told her she had a dermoid on that ovary and she does  indeed have one.  The problem is with size and the risk of torsion was  discussed with the patient.  However, she seems still very hesitant to  remove the cyst and I do not think it could be done laparoscopically and  could be done with preservation of her ovary.  She is so scared.  We  have agreed that further imaging could be done.  Her last  ultrasound was  in February.  We will repeat this prior to surgery.  If ultrasound  continues to show similar or stable size of the dermoid, if she does not  want to have it out, I would not push her, however, it seems to be  growing in size.  Given increased size with increased risk of torsion, I  think prudent thing would be to try to remove it laparoscopically,  however, and this will be the patient's choice and will be left to her.  We will get this schedule and call her with results of this ultrasound.  We will decide there on how to proceed, but will go ahead and schedule D  and C, hysteroscopy, endometrial ablation with HTA.           ______________________________  Tinnie Gens, MD     TP/MEDQ  D:  01/23/2010  T:  01/24/2010  Job:  [id number]

## 2010-12-24 NOTE — Assessment & Plan Note (Signed)
NAMENYRAH, DEMOS               ACCOUNT NO.:  [id number]   MEDICAL RECORD NO.:  [id number]          PATIENT TYPE:  POB   LOCATION:  [facility] at [facility]         FACILITY:  [facility]   PHYSICIAN:  Tinnie Gens, MD        DATE OF BIRTH:  November 02, 1965   DATE OF SERVICE:  04/30/2010                                  CLINIC NOTE   CHIEF COMPLAINT:  Postop check.   HISTORY OF PRESENT ILLNESS:  The patient is a 45 year old gravida 2,  para 2 who is status post a D and C hysteroscopy with hydrothermal  ablation and operative laparoscopy with right salpingectomy and right  ovarian cystectomy.  The patient's pathology revealed a hydrosalpinx and  a mature cystic teratoma.  Endometrial curettings with benign  proliferative endometrium and a benign endocervical polyp.  The patient  has not had significant bleeding.  She still complains of fatigue.  Postoperatively, her incisions seemed to be well healed.  There is still  some  pain in her umbilicus.   PHYSICAL EXAMINATION:  VITAL SIGNS:  On exam today, vitals are as in the  chart.  GENERAL:  She is well developed, well nourished in no acute distress.  ABDOMEN:  Soft, nontender, nondistended.  Incisions are well healed.   IMPRESSION:  Status post D and C, hysteroscopy, and hydrothermal  ablation for abnormal uterine bleeding and right salpingectomy and right  ovarian cystectomy for dermoid and hydrosalpinx.   PLAN:  Continue routine postop care.  The patient will call back to [place],  and she was asked to return to work.           ______________________________  Tinnie Gens, MD     TP/MEDQ  D:  04/30/2010  T:  05/01/2010  Job:  [id number]

## 2011-02-27 ENCOUNTER — Ambulatory Visit (INDEPENDENT_AMBULATORY_CARE_PROVIDER_SITE_OTHER): Payer: 59 | Admitting: Gynecology

## 2011-02-27 DIAGNOSIS — R87610 Atypical squamous cells of undetermined significance on cytologic smear of cervix (ASC-US): Secondary | ICD-10-CM

## 2011-03-09 ENCOUNTER — Inpatient Hospital Stay (INDEPENDENT_AMBULATORY_CARE_PROVIDER_SITE_OTHER)
Admission: RE | Admit: 2011-03-09 | Discharge: 2011-03-09 | Disposition: A | Discharge status: Home / Self Care | Payer: 59 | Source: Ambulatory Visit | Attending: Family Medicine | Admitting: Family Medicine

## 2011-03-09 DIAGNOSIS — H60399 Other infective otitis externa, unspecified ear: Secondary | ICD-10-CM

## 2011-09-04 ENCOUNTER — Other Ambulatory Visit: Payer: Self-pay | Admitting: *Deleted

## 2011-09-04 MED ORDER — HYDROCHLOROTHIAZIDE 12.5 MG PO CAPS: 12.5000 mg | ORAL_CAPSULE | ORAL | Status: DC

## 2011-09-11 ENCOUNTER — Ambulatory Visit (INDEPENDENT_AMBULATORY_CARE_PROVIDER_SITE_OTHER): Payer: 59 | Admitting: Family Medicine

## 2011-09-11 VITALS — BP 130/90 | HR 86 | Temp 98.2°F | Ht 66.0 in | Wt 225.5 lb

## 2011-09-11 DIAGNOSIS — Z0000 Encounter for general adult medical examination without abnormal findings: Secondary | ICD-10-CM | POA: Insufficient documentation

## 2011-09-11 DIAGNOSIS — E559 Vitamin D deficiency, unspecified: Secondary | ICD-10-CM

## 2011-09-11 DIAGNOSIS — R5383 Other fatigue: Secondary | ICD-10-CM

## 2011-09-11 DIAGNOSIS — I10 Essential (primary) hypertension: Secondary | ICD-10-CM

## 2011-09-11 DIAGNOSIS — R5381 Other malaise: Secondary | ICD-10-CM

## 2011-09-11 DIAGNOSIS — Z136 Encounter for screening for cardiovascular disorders: Secondary | ICD-10-CM

## 2011-09-11 LAB — CBC WITH DIFFERENTIAL/PLATELET
Basophils Relative: 0.7 % (ref 0.0–3.0)
Eosinophils Relative: 1.8 % (ref 0.0–5.0)
Lymphocytes Relative: 34.8 % (ref 12.0–46.0)
MCV: 91.5 fl (ref 78.0–100.0)
Monocytes Absolute: 0.6 10*3/uL (ref 0.1–1.0)
Monocytes Relative: 5.8 % (ref 3.0–12.0)
Neutrophils Relative %: 56.9 % (ref 43.0–77.0)
RBC: 4.82 Mil/uL (ref 3.87–5.11)
WBC: 11 10*3/uL — ABNORMAL HIGH (ref 4.5–10.5)

## 2011-09-11 LAB — BASIC METABOLIC PANEL
BUN: 10 mg/dL (ref 6–23)
Chloride: 105 mEq/L (ref 96–112)
Creatinine, Ser: 0.7 mg/dL (ref 0.4–1.2)

## 2011-09-11 LAB — LIPID PANEL
Cholesterol: 188 mg/dL (ref 0–200)
LDL Cholesterol: 124 mg/dL — ABNORMAL HIGH (ref 0–99)

## 2011-09-11 MED ORDER — HYDROCHLOROTHIAZIDE 12.5 MG PO CAPS: 12.5000 mg | ORAL_CAPSULE | ORAL | Status: DC

## 2011-09-11 MED ORDER — BUSPIRONE HCL 15 MG PO TABS: 15.0000 mg | ORAL_TABLET | Freq: Two times a day (BID) | ORAL | Status: DC

## 2011-09-11 NOTE — Progress Notes (Signed)
Subjective:    Patient ID: [name], female    DOB: [birth year], 46 y.o.   MRN: [id number]  HPI  46 yo here for CPX without complaints.  OBGYN- Dr. Shawnie Pons, will see her next month. Mammogram due next month.  HTN- has been well controlled on HCTZ 12.5 mg daily. Denies any LE edema, HA, blurred vision, CP or SOB.  Anxiety- stopped taking her Buspar months ago.  She felt it did help without any adverse reactions. Has been more anxious lately, would like to restart it.  Patient Active Problem List  Diagnoses  . VITAMIN D DEFICIENCY  . OBESITY  . ANEMIA, IRON DEFICIENCY  . TOBACCO ABUSE  . DEPRESSION, SITUATIONAL  . OTITIS EXTERNA  . OTITIS MEDIA, SEROUS  . HYPERTENSION  . URI  . LEG CRAMPS  . Adjustment disorder with anxious mood  . Routine general medical examination at a health care facility   Past Medical History  Diagnosis Date  . Anemia   . SVT (supraventricular tachycardia)   . Smoker   . Obesity   . Stress fracture   . Vitamin D deficiency    No past surgical history on file. History  Substance Use Topics  . Smoking status: Not on file  . Smokeless tobacco: Not on file  . Alcohol Use: Not on file   No family history on file. Allergies  Allergen Reactions  . Codeine   . Latex    Current outpatient prescriptions:hydrochlorothiazide (MICROZIDE) 12.5 MG capsule, Take 1 capsule (12.5 mg total) by mouth every morning., Disp: 30 capsule, Rfl: 11;  busPIRone (BUSPAR) 15 MG tablet, Take 1 tablet (15 mg total) by mouth 2 (two) times daily., Disp: 60 tablet, Rfl: 11   The PMH, PSH, Social History, Family History, Medications, and allergies have been reviewed in [facility], and have been updated if relevant.     Review of Systems See HPI Patient reports no  vision/ hearing changes,anorexia, weight change, fever ,adenopathy, persistant / recurrent hoarseness, swallowing issues, chest pain, edema,persistant / recurrent cough, hemoptysis, dyspnea(rest, exertional,  paroxysmal nocturnal), gastrointestinal  bleeding (melena, rectal bleeding), abdominal pain, excessive heart burn, GU symptoms(dysuria, hematuria, pyuria, voiding/incontinence  Issues) syncope, focal weakness, severe memory loss, concerning skin lesions, depression, anxiety, abnormal bruising/bleeding, major joint swelling, breast masses or abnormal vaginal bleeding.       Objective:   Physical Exam BP 130/90  Pulse 86  Temp(Src) 98.2 F (36.8 C) (Oral)  Ht 5\' 6"  (1.676 m)  Wt 225 lb 8 oz (102.286 kg)  BMI 36.40 kg/m2  General:  Well-developed,well-nourished,in no acute distress; alert,appropriate and cooperative throughout examination Head:  normocephalic and atraumatic.   Eyes:  vision grossly intact, pupils equal, pupils round, and pupils reactive to light.   Ears:  R ear normal and L ear normal.   Nose:  no external deformity.   Mouth:  good dentition.    Heart:  Normal rate and regular rhythm. S1 and S2 normal without gallop, murmur, click, rub or other extra sounds. Abdomen:  Bowel sounds positive,abdomen soft and non-tender without masses, organomegaly or hernias noted. Msk:  No deformity or scoliosis noted of thoracic or lumbar spine.   Extremities:  No clubbing, cyanosis, edema, or deformity noted with normal full range of motion of all joints.   Neurologic:  alert & oriented X3 and gait normal.   Skin:  Intact without suspicious lesions or rashes Cervical Nodes:  No lymphadenopathy noted Axillary Nodes:  No palpable lymphadenopathy Psych:  Cognition and  judgment appear intact. Alert and cooperative with normal attention span and concentration. No apparent delusions, illusions, hallucinations     Assessment & Plan:   1. Routine general medical examination at a health care facility  Reviewed preventive care protocols, scheduled due services, and updated immunizations Discussed nutrition, exercise, diet, and healthy lifestyle.  Basic Metabolic Panel (BMET) Lipid Profile    2. HYPERTENSION  Stable.  Continue current dose of HCTZ.

## 2011-09-11 NOTE — Patient Instructions (Signed)
Good to see you. We will call you with your lab results.   

## 2011-09-12 ENCOUNTER — Telehealth: Payer: Self-pay | Admitting: Family Medicine

## 2011-09-12 LAB — VITAMIN D 25 HYDROXY (VIT D DEFICIENCY, FRACTURES): Vit D, 25-Hydroxy: 39 ng/mL (ref 30–89)

## 2011-09-15 ENCOUNTER — Encounter: Payer: Self-pay | Admitting: Family Medicine

## 2011-09-15 ENCOUNTER — Encounter: Payer: Self-pay | Admitting: *Deleted

## 2011-09-15 NOTE — Telephone Encounter (Signed)
Patient advised as instructed via telephone of lab results.

## 2011-10-09 ENCOUNTER — Other Ambulatory Visit: Payer: Self-pay | Admitting: Family Medicine

## 2011-10-24 ENCOUNTER — Telehealth: Payer: Self-pay | Admitting: Internal Medicine

## 2011-10-24 ENCOUNTER — Telehealth: Payer: Self-pay | Admitting: *Deleted

## 2011-10-24 NOTE — Telephone Encounter (Signed)
Patient called stating that she started having chest pains last night and they are continuing today, pain in the middle of her chest, she stated that she does not now and hasn't been feeling clammy, sweating, no SOB, no arm pain or tingling.  She stated that she know what indigestion feels like and this doesn't feel the same.  Spoke with Dr. Sharen Hones for advise and he stated that patient should be evaluated in Urgent Care.  Patient advised and she stated that she would go to Urgent Care.  [place] in [place] so she may be seen in [place].

## 2011-10-24 NOTE — Telephone Encounter (Signed)
Noted! Thank you

## 2011-10-24 NOTE — Telephone Encounter (Signed)
Spoke with patient and she states she has had several episodes of pain in the middle of her chest that is not relieved with medications. When asked if she has seen her PCP or any MD to r/o heart she states she has not. States she had her physical with her PCP but did not tell them about these episodes. Patient states she had an episode last night. Patient advised to call her PCP and get evaluated and make sure this is not her heart prior to scheduling with [place].Strongly encouraged her to call her PCP ASAP to discuss. She will call her PCP and do this.

## 2011-10-24 NOTE — Telephone Encounter (Signed)
I don't see where I ever saw this pt before. She had a cardiology eval by Dr Clarene Duke in 2006 with normal nuclear study and EF 69%. They thought she was having non-cardiac chest pain. I am not sure if she needs a new cardial work up, her PCP Dr Dayton Martes may decide that. And may refer her to [place].

## 2011-10-27 ENCOUNTER — Encounter: Payer: Self-pay | Admitting: Internal Medicine

## 2011-11-17 ENCOUNTER — Other Ambulatory Visit: Payer: Self-pay | Admitting: *Deleted

## 2011-11-17 MED ORDER — HYDROCHLOROTHIAZIDE 12.5 MG PO CAPS: 12.5000 mg | ORAL_CAPSULE | ORAL | Status: DC

## 2011-11-26 ENCOUNTER — Encounter: Payer: Self-pay | Admitting: Family Medicine

## 2011-11-26 ENCOUNTER — Ambulatory Visit (INDEPENDENT_AMBULATORY_CARE_PROVIDER_SITE_OTHER): Payer: 59 | Admitting: Family Medicine

## 2011-11-26 VITALS — BP 120/80 | HR 76 | Temp 98.0°F | Wt 226.0 lb

## 2011-11-26 DIAGNOSIS — I10 Essential (primary) hypertension: Secondary | ICD-10-CM

## 2011-11-26 NOTE — Progress Notes (Signed)
  Subjective:    Patient ID: [name], female    DOB: [birth year], 46 y.o.   MRN: [id number]  HPI  46 yo here for follow up HTN.   HTN- has been well controlled on HCTZ 12.5 mg daily. Felt like it was up last few days- checked BP on home cuff and it was 140/100 this morning.  Denies any LE edema, HA, blurred vision, CP or SOB.   Patient Active Problem List  Diagnoses  . VITAMIN D DEFICIENCY  . OBESITY  . ANEMIA, IRON DEFICIENCY  . TOBACCO ABUSE  . DEPRESSION, SITUATIONAL  . OTITIS EXTERNA  . OTITIS MEDIA, SEROUS  . HYPERTENSION  . URI  . LEG CRAMPS  . Adjustment disorder with anxious mood  . Routine general medical examination at a health care facility   Past Medical History  Diagnosis Date  . Anemia   . SVT (supraventricular tachycardia)   . Smoker   . Obesity   . Stress fracture   . Vitamin D deficiency    No past surgical history on file. History  Substance Use Topics  . Smoking status: Not on file  . Smokeless tobacco: Not on file  . Alcohol Use: Not on file   No family history on file. Allergies  Allergen Reactions  . Codeine   . Latex    Current outpatient prescriptions:busPIRone (BUSPAR) 15 MG tablet, Take 1 tablet (15 mg total) by mouth 2 (two) times daily., Disp: 60 tablet, Rfl: 11;  hydrochlorothiazide (MICROZIDE) 12.5 MG capsule, Take 1 capsule (12.5 mg total) by mouth every morning., Disp: 90 capsule, Rfl: 2   The PMH, PSH, Social History, Family History, Medications, and allergies have been reviewed in [facility], and have been updated if relevant.     Review of Systems See HPI      Objective:   Physical Exam BP 120/80  Pulse 76  Temp(Src) 98 F (36.7 C) (Oral)  Wt 226 lb (102.513 kg)  General:  Well-developed,well-nourished,in no acute distress; alert,appropriate and cooperative throughout examination Head:  normocephalic and atraumatic.   Mouth:  good dentition.   Resp:  CTA bilaterally.  Heart:  Normal rate and regular rhythm.  S1 and S2 normal without gallop, murmur, click, rub or other extra sounds. Extremities:  No clubbing, cyanosis, edema, or deformity noted with normal full range of motion of all joints.   Neurologic:  alert & oriented X3 and gait normal.   Skin:  Intact without suspicious lesions or rashes Psych:  Cognition and judgment appear intact. Alert and cooperative with normal attention span and concentration. No apparent delusions, illusions, hallucinations     Assessment & Plan:   1. HYPERTENSION  Stable.  Reassurance provided. Advised bringing in home cuff to compare with ours. Continue current dose of HCTZ.

## 2011-12-03 ENCOUNTER — Encounter: Payer: Self-pay | Admitting: *Deleted

## 2011-12-15 ENCOUNTER — Ambulatory Visit (INDEPENDENT_AMBULATORY_CARE_PROVIDER_SITE_OTHER): Payer: 59 | Admitting: Internal Medicine

## 2011-12-15 ENCOUNTER — Encounter: Payer: Self-pay | Admitting: Internal Medicine

## 2011-12-15 VITALS — BP 110/82 | HR 84 | Ht 67.0 in | Wt 228.0 lb

## 2011-12-15 DIAGNOSIS — K219 Gastro-esophageal reflux disease without esophagitis: Secondary | ICD-10-CM

## 2011-12-15 NOTE — Progress Notes (Signed)
[name] [birth year] MRN [id number]    History of Present Illness:  This is a 46 year old white female with an episode of epigastric discomfort, nausea and indigestion for which she was evaluated in primary care and was given Prilosec 20 mg a day on 10/24/2011. She took it for about 2 weeks with complete resolution of all her symptoms. We saw her in 2003 for symptoms of irritable bowel syndrome and she was treated with antispasmodics and fiber. She has a positive family of gallbladder disease in her grandmother and had a normal abdominal ultrasound in 2003. She denies dysphagia, odynophagia or food intolerance.   Past Medical History  Diagnosis Date  . Anemia   . SVT (supraventricular tachycardia)   . Smoker   . Obesity   . Stress fracture   . Vitamin d deficiency   . Arthritis   . HTN (hypertension)    Past Surgical History  Procedure Date  . Ovarian cyst surgery     left  . Cesarean section     x 2  . Salpingectomy     right; adhesions  . Ovarian cyst surgery     left  . Tubal ligation   . Knee surgery     right x 2  . Appendectomy   . Hydrothermal ablation     uteral and cyst removal  . Hysteroscopy   . Left oophorectomy     reports that she has been smoking Cigarettes.  She has never used smokeless tobacco. She reports that she does not drink alcohol or use illicit drugs. family history includes Cancer in her maternal grandmother; Colon polyps in her mother; Diabetes in her brother; Heart disease in her father; Seizures in her brother; and Stroke in her brother and paternal grandfather.  There is no history of Colon cancer. Allergies  Allergen Reactions  . Codeine   . Latex         Review of Systems: Positive for being overweight. Irregular bowel habits.  The remainder of the 10 point ROS is negative except as outlined in H&P   Physical Exam: General appearance  Well developed, in no distress. Overweight Eyes- non icteric. HEENT nontraumatic,  normocephalic. Mouth no lesions, tongue papillated, no cheilosis. Neck supple without adenopathy, thyroid not enlarged, no carotid bruits, no JVD. Lungs Clear to auscultation bilaterally. Cor normal S1, normal S2, regular rhythm, no murmur,  quiet precordium. Abdomen: Protuberant but soft. I cannot reproduce her tenderness. Epigastrium is normal. Bowel sounds are active. There is no fluid wave. Rectal: Not done. Extremities no pedal edema. Skin no lesions. Neurological alert and oriented x 3. Psychological normal mood and affect.  Assessment and Plan:  Problem #1 Episode of gastroesophageal reflux controlled on 2 weeks of Prilosec 20 mg daily. She is currently asymptomatic off medications. She does not exhibit signs of esophageal stricture or esophagitis. Her symptoms have not been long-lasting and for that reason I feel that an upper endoscopy here is not necessary. She still has at least 15 tablets of Prilosec left and I asked her to take them as needed. She has 2 refills left. If she develops daily reflux, then we will proceed with an upper endoscopy to rule out Barrett's esophagus.   12/15/2011 Lina Sar

## 2011-12-15 NOTE — Patient Instructions (Addendum)
You have been given a separate informational sheet regarding your tobacco use, the importance of quitting and local resources to help you quit. CC: Dr Ruthe Mannan

## 2011-12-19 ENCOUNTER — Ambulatory Visit: Payer: 59 | Admitting: Internal Medicine

## 2011-12-23 ENCOUNTER — Encounter: Payer: Self-pay | Admitting: Family Medicine

## 2011-12-23 ENCOUNTER — Ambulatory Visit (INDEPENDENT_AMBULATORY_CARE_PROVIDER_SITE_OTHER): Payer: 59 | Admitting: Nurse Practitioner

## 2011-12-23 VITALS — BP 126/87 | HR 75 | Ht 67.0 in | Wt 229.0 lb

## 2011-12-23 DIAGNOSIS — M719 Bursopathy, unspecified: Secondary | ICD-10-CM

## 2011-12-23 DIAGNOSIS — M7150 Other bursitis, not elsewhere classified, unspecified site: Secondary | ICD-10-CM

## 2011-12-23 DIAGNOSIS — Z01419 Encounter for gynecological examination (general) (routine) without abnormal findings: Secondary | ICD-10-CM

## 2011-12-23 DIAGNOSIS — Z124 Encounter for screening for malignant neoplasm of cervix: Secondary | ICD-10-CM

## 2011-12-23 DIAGNOSIS — K219 Gastro-esophageal reflux disease without esophagitis: Secondary | ICD-10-CM

## 2011-12-23 MED ORDER — OMEPRAZOLE 20 MG PO CPDR: 20.0000 mg | DELAYED_RELEASE_CAPSULE | Freq: Every day | ORAL | Status: DC

## 2011-12-23 NOTE — Patient Instructions (Signed)
Pap Test A Pap test is a sampling of cells from a woman's cervix. The cervix is the opening between the vagina (birth canal) and the uterus (the bottom part of the womb). The cells are scraped from the cervix during a pelvic exam. These cells are then looked at under a microscope to see if the cells are normal or to see if a cancer is developing or there are changes that suggest a cancer will develop. Cervical dysplasia is a condition in which a woman has abnormal changes in the top layer of cells of her cervix. These changes are an early sign that cervical cancer may develop. Pap tests also look for the human papilloma virus (HPV) because it has 4 types that are responsible for 70% of cervical cancer. Infections can also be found during a Pap test such as bacteria, fungus, protozoa and viruses.  Cervical cancer is harder to treat and less likely to have a good outcome if left untreated. Catching the disease at an early stage leads to a better outcome. Since the Pap test was introduced 60 years ago, deaths from cervical cancer have decreased by 70%. Every woman should keep up to date with Pap tests. RISK FACTORS FOR CERVICAL CANCER INCLUDE:   Becoming sexually active before age 18.   Being the daughter of a woman who took diethylstilbestrol (DES) during pregnancy.   Having a sexual partner who has or has had cancer of the penis.   Having a sexual partner whose past partner had cervical cancer or cervical dysplasia (early cell changes which suggest a cancer may develop).   Having a weakened immune system. An example would be HIV or other immunodeficiency disorder.   Having had a sexually transmitted infection such as chlamydia, gonorrhea or HPV.   Having had an abnormal Pap or cancer of the vagina or vulva.   Having had more than one sexual partner.   A history of cervical cancer in a woman's sister or mother.   Not using condoms with new sexual partners.   Smoking.  WHO SHOULD HAVE PAP  TESTS  A Pap test is done to screen for cervical cancer.   The first Pap test should be done at age 21.   Between ages 21 and 29, Pap tests are repeated every 2 years.   Beginning at age 30, you are advised to have a Pap test every 3 years as long as your past 3 Pap tests have been normal.   Some women have medical problems that increase the chance of getting cervical cancer. Talk to your caregiver about these problems. It is especially important to talk to your caregiver if a new problem develops soon after your last Pap test. In these cases, your caregiver may recommend more frequent screening and Pap tests.   The above recommendations are the same for women who have or have not gotten the vaccine for HPV (Human Papillomavirus).   If you had a hysterectomy for a problem that was not a cancer or a condition that could lead to cancer, then you no longer need Pap tests. However, even if you no longer need a Pap test, a regular exam is a good idea to make sure no other problems are starting.    If you are between ages 65 and 70, and you have had normal Pap tests going back 10 years, you no longer need Pap tests. However, even if you no longer need a Pap test, a regular exam is a good idea   to make sure no other problems are starting.    If you have had past treatment for cervical cancer or a condition that could lead to cancer, you need Pap tests and screening for cancer for at least 20 years after your treatment.   If Pap tests have been discontinued, risk factors (such as a new sexual partner) need to be re-assessed to determine if screening should be resumed.   Some women may need screenings more often if they are at high risk for cervical cancer.  PREPARATION FOR A PAP TEST A Pap test should be performed during the weeks before the start of menstruation. Women should not douche or have sexual intercourse for 24 hours before the test. No vaginal creams, diaphragms, or tampons should be  used for 24 hours before the test. To minimize discomfort, a woman should empty her bladder just before the exam. TAKING THE PAP TEST The caregiver will perform a pelvic exam. A metal or plastic instrument (speculum) is placed in the vagina. This is done before your caregiver does a bimanual exam of your internal female organs. This instrument allows your caregiver to see the inside of the vagina and look at the cervix. A small, sterile brush is used to take a sample of cells from the internal opening of the cervix. A small wooden spatula is used to scrape the outside of the cervix. Neither of these two methods to collect cells will cause you pain. These two scrapings are placed on a glass slide or in a small bottle filled with a special liquid. The cells are looked at later under a microscope in a lab. A specialist will look at these cells and determine if the cells are normal. RESULTS OF YOUR PAP TEST  A healthy Pap test shows no abnormal cells or evidence of inflammation.   The presence of abnormally growing cells on the surface of the cervix may be reported as an abnormal Pap test. Different categories of findings are used to describe your Pap test. Your caregiver will go over the importance of these findings with you. The caregiver will then determine what follow-up is needed or when you should have your next pap test.   If you have had two or more abnormal Pap tests:   You may be asked to have a colposcopy. This is a test in which the cervix is viewed with a special lighted microscope.   A cervical tissue sample (biopsy) may also be needed. This involves taking a small tissue sample from the cervix. The sample is looked at under a microscope to find the cause of the abnormal cells. Make sure you find out the results of the Pap test. If you have not received the results within two weeks, contact your caregiver's office for the results. Do not assume everything is normal if you have not heard from  your caregiver or medical facility. It is important to follow up on all of your test results.  Document Released: 10/18/2002 Document Revised: 07/17/2011 Document Reviewed: 07/22/2011 [employer] Patient Information 2012 [employer]. 

## 2011-12-23 NOTE — Progress Notes (Signed)
Subjective:     Patient ID: [name], female   DOB: [birth year], 46 y.o.   MRN: [id number]  HPI Pt is here today for yearly pap / pelvic exam. She has a yearly physical with Dr Dayton Martes who controls her HTN and other medical problems. Labs were drawn at that visit and her LDL was elevated and HDL low. Her fasting sugar was 93. These labs were not reviewed with patients prior to today. Her pap results last year were ASCUS with no High Risk HPV noted. She was advised to follow up in one year. She denies ant problems today. She has had BTL for contraception. Ablation for heavy menses.    Review of Systems  Constitutional: Negative.   HENT: Negative.   Eyes: Negative.   Respiratory: Negative.   Cardiovascular: Negative.   Gastrointestinal:       Acid reflux  Genitourinary: Negative.   Musculoskeletal: Negative.   Neurological: Negative.   Hematological: Negative.   Psychiatric/Behavioral: Negative.        Objective:   Physical Exam  Constitutional: She is oriented to person, place, and time. She appears well-developed and well-nourished.       Obese  HENT:  Head: Normocephalic and atraumatic.  Eyes: Pupils are equal, round, and reactive to light.  Neck: Normal range of motion. No tracheal deviation present. No thyromegaly present.  Cardiovascular: Normal rate, regular rhythm and normal heart sounds.   Pulmonary/Chest: Effort normal.  Abdominal: Soft. She exhibits no distension and no mass. There is no tenderness. There is no rebound and no guarding.  Genitourinary: Vagina normal and uterus normal.  Musculoskeletal: Normal range of motion.  Neurological: She is alert and oriented to person, place, and time.  Skin: Skin is warm and dry.  Psychiatric: She has a normal mood and affect. Her behavior is normal. Judgment and thought content normal.       Assessment:     Yearly Pap/ Pelvic Exam Obesity    Plan:     Pap Smear done today Lengthy discussion concerning labs and diet  changes RTC one year

## 2011-12-23 NOTE — Progress Notes (Signed)
Patient is here for yearly exam.  She would like a refill of her Prilosec.  She also is still having some discomfort in her pelvis, she has her right ovary, but not her left and she has had an ablation.

## 2012-02-13 ENCOUNTER — Ambulatory Visit (INDEPENDENT_AMBULATORY_CARE_PROVIDER_SITE_OTHER): Payer: 59 | Admitting: Family Medicine

## 2012-02-13 ENCOUNTER — Encounter: Payer: Self-pay | Admitting: Family Medicine

## 2012-02-13 VITALS — BP 120/72 | HR 101 | Temp 98.6°F | Ht 67.0 in | Wt 222.8 lb

## 2012-02-13 DIAGNOSIS — J069 Acute upper respiratory infection, unspecified: Secondary | ICD-10-CM

## 2012-02-13 DIAGNOSIS — J029 Acute pharyngitis, unspecified: Secondary | ICD-10-CM

## 2012-02-13 DIAGNOSIS — J020 Streptococcal pharyngitis: Secondary | ICD-10-CM | POA: Insufficient documentation

## 2012-02-13 LAB — POCT RAPID STREP A (OFFICE): Rapid Strep A Screen: POSITIVE — AB

## 2012-02-13 MED ORDER — AMOXICILLIN 500 MG PO TABS: 1000.0000 mg | ORAL_TABLET | Freq: Two times a day (BID) | ORAL | Status: AC

## 2012-02-13 NOTE — Progress Notes (Signed)
  Subjective:    Patient ID: [name], female    DOB: 28-Mar-1966, 46 y.o.   MRN: [id number]  Sinusitis This is a new problem. The current episode started in the past 7 days (2 days ago). The problem has been gradually worsening since onset. There has been no fever. She is experiencing no pain. Associated symptoms include sinus pressure and a sore throat. Pertinent negatives include no shortness of breath. Treatments tried: Nyquil. The treatment provided moderate relief.    Smoker No recent antibiotics. Review of Systems  HENT: Positive for sore throat and sinus pressure.   Respiratory: Negative for shortness of breath.   Cardiovascular: Negative for chest pain, palpitations and leg swelling.       Objective:   Physical Exam  Constitutional: Vital signs are normal. She appears well-developed and well-nourished. She is cooperative.  Non-toxic appearance. She does not appear ill. No distress.  HENT:  Head: Normocephalic.  Right Ear: Hearing, tympanic membrane, external ear and ear canal normal. Tympanic membrane is not erythematous, not retracted and not bulging.  Left Ear: Hearing, tympanic membrane, external ear and ear canal normal. Tympanic membrane is not erythematous, not retracted and not bulging.  Nose: Mucosal edema and rhinorrhea present. Right sinus exhibits no maxillary sinus tenderness and no frontal sinus tenderness. Left sinus exhibits no maxillary sinus tenderness and no frontal sinus tenderness.  Mouth/Throat: Uvula is midline, oropharynx is clear and moist and mucous membranes are normal.  Eyes: Conjunctivae, EOM and lids are normal. Pupils are equal, round, and reactive to light. No foreign bodies found.  Neck: Trachea normal and normal range of motion. Neck supple. Carotid bruit is not present. No mass and no thyromegaly present.  Cardiovascular: Normal rate, regular rhythm, S1 normal, S2 normal, normal heart sounds, intact distal pulses and normal pulses.  Exam  reveals no gallop and no friction rub.   No murmur heard. Pulmonary/Chest: Effort normal and breath sounds normal. Not tachypneic. No respiratory distress. She has no decreased breath sounds. She has no wheezes. She has no rhonchi. She has no rales.  Neurological: She is alert.  Skin: Skin is warm, dry and intact. No rash noted.  Psychiatric: Her speech is normal and behavior is normal. Judgment normal. Her mood appears not anxious. Cognition and memory are normal. She does not exhibit a depressed mood.          Assessment & Plan:

## 2012-02-13 NOTE — Assessment & Plan Note (Signed)
She does have positive strep test, but has slight beginnings of cough, mild congestion suggesting possible viral source... Given positive test and family exposure.. Will treat with amox. Otherwise symptomatic care.

## 2012-02-13 NOTE — Assessment & Plan Note (Signed)
Symptomatic care 

## 2012-02-13 NOTE — Patient Instructions (Addendum)
  Tylenol for throat pain.  Start antibiotics to cover strep throat. Call if not improving as expected.

## 2012-02-24 ENCOUNTER — Telehealth: Payer: Self-pay | Admitting: Family Medicine

## 2012-02-24 NOTE — Telephone Encounter (Signed)
Caller: Marshelle/Patient; PCP: Ruthe Mannan (Nest[facility] Ramp); CB#: [phone]; ; ; Call regarding Vaginal Itching;  Finished antibiotic for strep 7-15. Developed vaginal itching 7-14 and has "white" discharge. States MD advised if this developed, call office and would not have to come back in. LMP-uterine ablation-no cycle. Uses [facility]. PLEASE CALL PT AND ADVISE CAN CALL IN MEDICINE

## 2012-02-25 MED ORDER — FLUCONAZOLE 150 MG PO TABS: 150.0000 mg | ORAL_TABLET | Freq: Once | ORAL | Status: AC

## 2012-02-25 NOTE — Telephone Encounter (Signed)
Will forward to Dr. Ermalene Searing since she saw pt.

## 2012-02-25 NOTE — Telephone Encounter (Signed)
Yeast infection after antibiotics.. Will send in diflucan

## 2012-03-05 ENCOUNTER — Telehealth: Payer: Self-pay | Admitting: Family Medicine

## 2012-03-05 NOTE — Telephone Encounter (Signed)
Pt has appt with Dr Dayton Martes 12/08/11 at 9 am.; has dry hacky cough, worse when lays down,throat does not hurt but feels like "cotton in throat" ? Swollen. No SOB, no fever. Nyquil and Dimetapp cold formula has not helped. Is other suggestion of what can take before seen on 03/08/12. [facility] [address]

## 2012-03-05 NOTE — Telephone Encounter (Signed)
You can add Ibuprofen or Alleve for the pain and inflammation. OTC claritin or allegra D may help as well.

## 2012-03-05 NOTE — Telephone Encounter (Signed)
Advised patient

## 2012-03-05 NOTE — Telephone Encounter (Signed)
Advice on OTC med. Has appt on Monday

## 2012-03-05 NOTE — Telephone Encounter (Signed)
Left message asking pt to call back. 

## 2012-03-08 ENCOUNTER — Encounter: Payer: Self-pay | Admitting: Family Medicine

## 2012-03-08 ENCOUNTER — Ambulatory Visit (INDEPENDENT_AMBULATORY_CARE_PROVIDER_SITE_OTHER): Payer: 59 | Admitting: Family Medicine

## 2012-03-08 VITALS — BP 102/82 | HR 76 | Temp 97.9°F | Wt 223.0 lb

## 2012-03-08 DIAGNOSIS — J020 Streptococcal pharyngitis: Secondary | ICD-10-CM

## 2012-03-08 MED ORDER — HYDROCHLOROTHIAZIDE 12.5 MG PO CAPS: 12.5000 mg | ORAL_CAPSULE | ORAL | Status: DC

## 2012-03-08 MED ORDER — AZITHROMYCIN 250 MG PO TABS: ORAL_TABLET | ORAL | Status: AC

## 2012-03-08 NOTE — Progress Notes (Signed)
Subjective:    Patient ID: [name], female    DOB: Nov 26, 1965, 46 y.o.   MRN: [id number]  46 yo here to follow up strep. Saw Dr. B on 02/13/2012- Rapid strep positive, treated with 10 day course of amoxicillin.  Throat feels better but now has a productive cough that seems to be getting worse. No fevers.  No chills. No SOB.  Smoker No recent antibiotics.  Patient Active Problem List  Diagnosis  . VITAMIN D DEFICIENCY  . OBESITY  . ANEMIA, IRON DEFICIENCY  . TOBACCO ABUSE  . DEPRESSION, SITUATIONAL  . OTITIS EXTERNA  . OTITIS MEDIA, SEROUS  . HYPERTENSION  . LEG CRAMPS  . Adjustment disorder with anxious mood  . Routine general medical examination at a health care facility  . Bursitis  . Strep throat   Past Medical History  Diagnosis Date  . Anemia   . SVT (supraventricular tachycardia)   . Smoker   . Obesity   . Stress fracture   . Vitamin d deficiency   . Arthritis   . HTN (hypertension)   . SVT (supraventricular tachycardia)   . Arthritis   . Abnormal uterine bleeding    Past Surgical History  Procedure Date  . Ovarian cyst surgery     left  . Cesarean section     x 2  . Salpingectomy     right; adhesions  . Ovarian cyst surgery     left  . Tubal ligation   . Knee surgery     right x 2  . Appendectomy   . Hydrothermal ablation     uteral and cyst removal  . Hysteroscopy   . Left oophorectomy   . Dermoid cyst  excision    History  Substance Use Topics  . Smoking status: Current Everyday Smoker -- 1.0 packs/day for 25 years    Types: Cigarettes  . Smokeless tobacco: Never Used  . Alcohol Use: No   Family History  Problem Relation Age of Onset  . Diabetes Brother   . Seizures Brother   . Colon cancer Neg Hx   . Cancer Maternal Grandmother      gallbladder  . Stroke Paternal Grandfather   . Stroke Brother   . Colon polyps Mother   . Heart disease Father    Allergies  Allergen Reactions  . Codeine   . Latex    Current  Outpatient Prescriptions on File Prior to Visit  Medication Sig Dispense Refill  . DISCONTD: hydrochlorothiazide (MICROZIDE) 12.5 MG capsule Take 1 capsule (12.5 mg total) by mouth every morning.  90 capsule  2   The PMH, PSH, Social History, Family History, Medications, and allergies have been reviewed in [facility], and have been updated if relevant.  ROS: See HPI      Objective:   Physical Exam  BP 102/82  Pulse 76  Temp 97.9 F (36.6 C)  Wt 223 lb (101.152 kg)  Constitutional: Vital signs are normal. She appears well-developed and well-nourished. She is cooperative.  Non-toxic appearance. She does not appear ill. No distress.  HENT:  Head: Normocephalic.  Right Ear: Hearing, tympanic membrane, external ear and ear canal normal. Tympanic membrane is not erythematous, not retracted and not bulging.  Left Ear: Hearing, tympanic membrane, external ear and ear canal normal. Tympanic membrane is not erythematous, not retracted and not bulging.  Mouth/Throat: Uvula is midline, oropharynx is clear and moist and mucous membranes are normal.  Eyes: Conjunctivae, EOM and lids are normal. Pupils  are equal, round, and reactive to light. No foreign bodies found.  Neck: Trachea normal and normal range of motion. Neck supple. Carotid bruit is not present. No mass and no thyromegaly present.  Cardiovascular: Normal rate, regular rhythm, S1 normal, S2 normal, normal heart sounds, intact distal pulses and normal pulses.  Exam reveals no gallop and no friction rub.   No murmur heard. Pulmonary/Chest: Effort normal and breath sounds normal. Not tachypneic. No respiratory distress. She has no decreased breath sounds. She has no wheezes. She has no rhonchi. She has no rales.  Neurological: She is alert.  Skin: Skin is warm, dry and intact. No rash noted.  Psychiatric: Her speech is normal and behavior is normal. Judgment normal. Her mood appears not anxious. Cognition and memory are normal. She does not  exhibit a depressed mood.      Assessment & Plan:   1. Strep throat    S/p 10 day course of amoxicillin and afebrile. Lungs sound clear but she is a smoker.  No indication on exam of post strep PNA. Given rx for zpack to fill if she spike a temp. The patient indicates understanding of these issues and agrees with the plan.

## 2012-03-08 NOTE — Patient Instructions (Addendum)
Good to see you. Take Zpack if you spike a temperature or if your cough progresses.

## 2012-03-09 ENCOUNTER — Ambulatory Visit (INDEPENDENT_AMBULATORY_CARE_PROVIDER_SITE_OTHER): Payer: 59 | Admitting: Obstetrics and Gynecology

## 2012-03-09 ENCOUNTER — Encounter: Payer: Self-pay | Admitting: Obstetrics and Gynecology

## 2012-03-09 VITALS — BP 109/85 | HR 80 | Ht 66.0 in | Wt 222.0 lb

## 2012-03-09 DIAGNOSIS — N760 Acute vaginitis: Secondary | ICD-10-CM

## 2012-03-09 NOTE — Progress Notes (Signed)
Patient ID: [name], female   DOB: 21-Nov-1965, 46 y.o.   MRN: [id number] 46 yo G2P2 presenting today for evaluation of abnormal discharge. Patient reports noticing a clear, watery vaginal discharge on Friday which was non-pruritic and without odor. This discharge had resolved by Saturday. Patient has recently completed a course of Amoxicillin for Strep throat which she follwed with a one time dose of Diflucan and is now taking a z-pac. Patient wanted to make sure everything was okay  SSE: thin white discharge without odor. Normal vaginal mucosa and cervix.  A/P 45yo G2P2 with vaginitis - wet prep collected - patient will be contacted with any abnormal results.

## 2012-03-09 NOTE — Progress Notes (Signed)
Patient has had a bout of clear discharge that turned brownish in color.  She has had an ablation and has not had any bleeding since her ablation.  She has recently had a yeast infection due to antiobiotics from a strep infection.

## 2012-03-10 LAB — WET PREP, GENITAL
Clue Cells Wet Prep HPF POC: NONE SEEN
Trich, Wet Prep: NONE SEEN
WBC, Wet Prep HPF POC: NONE SEEN
Yeast Wet Prep HPF POC: NONE SEEN

## 2012-08-12 ENCOUNTER — Other Ambulatory Visit: Payer: Self-pay | Admitting: Family Medicine

## 2012-09-27 ENCOUNTER — Encounter: Payer: Self-pay | Admitting: Family Medicine

## 2012-10-05 ENCOUNTER — Other Ambulatory Visit: Payer: Self-pay | Admitting: Family Medicine

## 2012-10-05 DIAGNOSIS — E559 Vitamin D deficiency, unspecified: Secondary | ICD-10-CM

## 2012-10-05 DIAGNOSIS — Z136 Encounter for screening for cardiovascular disorders: Secondary | ICD-10-CM

## 2012-10-05 DIAGNOSIS — Z0000 Encounter for general adult medical examination without abnormal findings: Secondary | ICD-10-CM

## 2012-10-05 DIAGNOSIS — I10 Essential (primary) hypertension: Secondary | ICD-10-CM

## 2012-10-05 DIAGNOSIS — D509 Iron deficiency anemia, unspecified: Secondary | ICD-10-CM

## 2012-10-13 ENCOUNTER — Other Ambulatory Visit (INDEPENDENT_AMBULATORY_CARE_PROVIDER_SITE_OTHER): Payer: 59

## 2012-10-13 DIAGNOSIS — E559 Vitamin D deficiency, unspecified: Secondary | ICD-10-CM

## 2012-10-13 DIAGNOSIS — I10 Essential (primary) hypertension: Secondary | ICD-10-CM

## 2012-10-13 DIAGNOSIS — Z136 Encounter for screening for cardiovascular disorders: Secondary | ICD-10-CM

## 2012-10-13 DIAGNOSIS — D509 Iron deficiency anemia, unspecified: Secondary | ICD-10-CM

## 2012-10-13 LAB — CBC WITH DIFFERENTIAL/PLATELET
Basophils Absolute: 0.1 10*3/uL (ref 0.0–0.1)
Eosinophils Absolute: 0.3 10*3/uL (ref 0.0–0.7)
HCT: 42.3 % (ref 36.0–46.0)
Lymphocytes Relative: 37.9 % (ref 12.0–46.0)
Lymphs Abs: 3.3 10*3/uL (ref 0.7–4.0)
MCHC: 34.1 g/dL (ref 30.0–36.0)
Monocytes Relative: 6.3 % (ref 3.0–12.0)
Neutro Abs: 4.6 10*3/uL (ref 1.4–7.7)
Platelets: 320 10*3/uL (ref 150.0–400.0)
RDW: 13.2 % (ref 11.5–14.6)

## 2012-10-13 LAB — COMPREHENSIVE METABOLIC PANEL
ALT: 9 U/L (ref 0–35)
AST: 18 U/L (ref 0–37)
CO2: 28 mEq/L (ref 19–32)
Calcium: 9.1 mg/dL (ref 8.4–10.5)
Chloride: 104 mEq/L (ref 96–112)
GFR: 106.4 mL/min (ref >60.00)
Potassium: 4.3 mEq/L (ref 3.5–5.1)
Sodium: 138 mEq/L (ref 135–145)
Total Protein: 6.9 g/dL (ref 6.0–8.3)

## 2012-10-13 LAB — LIPID PANEL
LDL Cholesterol: 109 mg/dL — ABNORMAL HIGH (ref 0–99)
Total CHOL/HDL Ratio: 5

## 2012-10-20 ENCOUNTER — Encounter: Payer: Self-pay | Admitting: Family Medicine

## 2012-10-20 ENCOUNTER — Ambulatory Visit (INDEPENDENT_AMBULATORY_CARE_PROVIDER_SITE_OTHER): Payer: 59 | Admitting: Family Medicine

## 2012-10-20 VITALS — BP 114/78 | HR 76 | Temp 97.5°F | Ht 66.25 in | Wt 221.0 lb

## 2012-10-20 DIAGNOSIS — R635 Abnormal weight gain: Secondary | ICD-10-CM

## 2012-10-20 DIAGNOSIS — I10 Essential (primary) hypertension: Secondary | ICD-10-CM

## 2012-10-20 DIAGNOSIS — Z0000 Encounter for general adult medical examination without abnormal findings: Secondary | ICD-10-CM

## 2012-10-20 DIAGNOSIS — F17200 Nicotine dependence, unspecified, uncomplicated: Secondary | ICD-10-CM

## 2012-10-20 DIAGNOSIS — E559 Vitamin D deficiency, unspecified: Secondary | ICD-10-CM

## 2012-10-20 MED ORDER — ERGOCALCIFEROL 1.25 MG (50000 UT) PO CAPS: 50000.0000 [IU] | ORAL_CAPSULE | ORAL | Status: DC

## 2012-10-20 NOTE — Patient Instructions (Addendum)
I'm so proud of you for exercising regularly!  You can do this! Try adding Colace 50 - 100 mg several days per week.  Take Vit D3 50,000 units weekly for 6 weeks - , then continue VitD 1600 IU daily (over the counter).  Recheck Vit D in 8-12 weeks (268.0)

## 2012-10-20 NOTE — Progress Notes (Signed)
Subjective:    Patient ID: [name], female    DOB: [birth year], 47 y.o.   MRN: [id number]  HPI  Very pleasant 47 yo female here for CPX.  OBGYN- Dr. Shawnie Pons, will see her next month. Mammogram normal last month.  HTN- has been well controlled on HCTZ 12.5 mg daily. Denies any LE edema, HA, blurred vision, CP or SOB. Lab Results  Component Value Date   CREATININE 0.6 10/13/2012    Vit D deficiency- Vit D 22 this month.  She has been very fatigued lately.  Has gained 60 pounds in last 3 years.  Started exercising four days per week last week and trying to eat better.    Lab Results  Component Value Date   CHOL 168 10/13/2012   HDL 33.40* 10/13/2012   LDLCALC 109* 10/13/2012   TRIG 128.0 10/13/2012   CHOLHDL 5 10/13/2012     Patient Active Problem List  Diagnosis  . VITAMIN D DEFICIENCY  . OBESITY  . ANEMIA, IRON DEFICIENCY  . TOBACCO ABUSE  . DEPRESSION, SITUATIONAL  . HYPERTENSION  . Adjustment disorder with anxious mood  . Routine general medical examination at a health care facility   Past Medical History  Diagnosis Date  . Anemia   . SVT (supraventricular tachycardia)   . Smoker   . Obesity   . Stress fracture   . Vitamin d deficiency   . Arthritis   . HTN (hypertension)   . SVT (supraventricular tachycardia)   . Arthritis   . Abnormal uterine bleeding    Past Surgical History  Procedure Laterality Date  . Ovarian cyst surgery      left  . Cesarean section      x 2  . Salpingectomy      right; adhesions  . Ovarian cyst surgery      left  . Tubal ligation    . Knee surgery      right x 2  . Appendectomy    . Hydrothermal ablation      uteral and cyst removal  . Hysteroscopy    . Left oophorectomy    . Dermoid cyst  excision     History  Substance Use Topics  . Smoking status: Current Every Day Smoker -- 1.00 packs/day for 25 years    Types: Cigarettes  . Smokeless tobacco: Never Used  . Alcohol Use: No   Family History  Problem Relation  Age of Onset  . Diabetes Brother   . Seizures Brother   . Colon cancer Neg Hx   . Cancer Maternal Grandmother      gallbladder  . Stroke Paternal Grandfather   . Stroke Brother   . Colon polyps Mother   . Heart disease Father    Allergies  Allergen Reactions  . Codeine   . Latex    Current outpatient prescriptions:hydrochlorothiazide (MICROZIDE) 12.5 MG capsule, Take 1 capsule (12.5 mg total) by mouth every morning., Disp: 30 capsule, Rfl: 11;  hydrochlorothiazide (MICROZIDE) 12.5 MG capsule, TAKE 1 CAPSULE EVERY MORNING, Disp: 90 capsule, Rfl: 1;  omeprazole (PRILOSEC) 20 MG capsule, , Disp: , Rfl:    The PMH, PSH, Social History, Family History, Medications, and allergies have been reviewed in [facility], and have been updated if relevant.     Review of Systems See HPI Patient reports no  vision/ hearing changes,anorexia, weight change, fever ,adenopathy, persistant / recurrent hoarseness, swallowing issues, chest pain, edema,persistant / recurrent cough, hemoptysis, dyspnea(rest, exertional, paroxysmal nocturnal), gastrointestinal  bleeding (  melena, rectal bleeding), abdominal pain, excessive heart burn, GU symptoms(dysuria, hematuria, pyuria, voiding/incontinence  Issues) syncope, focal weakness, severe memory loss, concerning skin lesions, depression, anxiety, abnormal bruising/bleeding, major joint swelling, breast masses or abnormal vaginal bleeding.   Does endorse some mild constipation     Objective:   Physical Exam BP 114/78  Pulse 76  Temp(Src) 97.5 F (36.4 C)  Ht 5' 6.25" (1.683 m)  Wt 221 lb (100.245 kg)  BMI 35.39 kg/m2  General:  Well-developed,well-nourished,in no acute distress; alert,appropriate and cooperative throughout examination Head:  normocephalic and atraumatic.   Eyes:  vision grossly intact, pupils equal, pupils round, and pupils reactive to light.   Ears:  R ear normal and L ear normal.   Nose:  no external deformity.   Mouth:  good dentition.     Heart:  Normal rate and regular rhythm. S1 and S2 normal without gallop, murmur, click, rub or other extra sounds. Abdomen:  Bowel sounds positive,abdomen soft and non-tender without masses, organomegaly or hernias noted. Msk:  No deformity or scoliosis noted of thoracic or lumbar spine.   Extremities:  No clubbing, cyanosis, edema, or deformity noted with normal full range of motion of all joints.   Neurologic:  alert & oriented X3 and gait normal.   Skin:  Intact without suspicious lesions or rashes Cervical Nodes:  No lymphadenopathy noted Axillary Nodes:  No palpable lymphadenopathy Psych:  Cognition and judgment appear intact. Alert and cooperative with normal attention span and concentration. No apparent delusions, illusions, hallucinations     Assessment & Plan:  1. Routine general medical examination at a health care facility Reviewed preventive care protocols, scheduled due services, and updated immunizations Discussed nutrition, exercise, diet, and healthy lifestyle.  2. VITAMIN D DEFICIENCY Deteriorated.  Start high dose replacement- 50,000 IU weekly, then continue with 1600 IU daily.  Recheck Vit D in 8-12 weeks.    3. HYPERTENSION Stable on current low dose HCTZ.  4. Weight gain She is motivated to lose weight!  Encouraged her to continue! Wt Readings from Last 3 Encounters:  10/20/12 221 lb (100.245 kg)  03/09/12 222 lb (100.699 kg)  03/08/12 223 lb (101.152 kg)

## 2012-11-16 ENCOUNTER — Encounter: Payer: Self-pay | Admitting: *Deleted

## 2012-11-16 ENCOUNTER — Ambulatory Visit (INDEPENDENT_AMBULATORY_CARE_PROVIDER_SITE_OTHER): Payer: 59 | Admitting: Family Medicine

## 2012-11-16 ENCOUNTER — Encounter: Payer: Self-pay | Admitting: Family Medicine

## 2012-11-16 VITALS — BP 130/70 | HR 74 | Temp 98.3°F | Ht 66.25 in | Wt 220.5 lb

## 2012-11-16 DIAGNOSIS — R42 Dizziness and giddiness: Secondary | ICD-10-CM

## 2012-11-16 NOTE — Assessment & Plan Note (Addendum)
No sign of neuro changes or red flag. Likely due to inner ear... mild BPPV vs viral labyrinthitis.  Symptomatic care and time.

## 2012-11-16 NOTE — Progress Notes (Signed)
  Subjective:    Patient ID: [name], female    DOB: 16-Feb-1966, 47 y.o.   MRN: [id number]  HPI  47 year old female  with 24 hours of mild vertigo, feeling out of balance, fatigue.  No presyncope.  She feels better today.  No headache, no new neuro symptoms. No allergy sym[ptoms, no cough congestion. No sinus or ear pressure.  No CP or SOB.  No medication used.  She has had vertigo in past, few years ago.    Review of Systems  Constitutional: Negative for fever and fatigue.  HENT: Negative for ear pain.   Eyes: Negative for pain.  Respiratory: Negative for chest tightness and shortness of breath.   Cardiovascular: Negative for chest pain, palpitations and leg swelling.  Gastrointestinal: Negative for abdominal pain.  Genitourinary: Negative for dysuria.       Objective:   Physical Exam  Constitutional: Vital signs are normal. She appears well-developed and well-nourished. She is cooperative.  Non-toxic appearance. She does not appear ill. No distress.  HENT:  Head: Normocephalic.  Right Ear: Hearing, tympanic membrane, external ear and ear canal normal. Tympanic membrane is not erythematous, not retracted and not bulging.  Left Ear: Hearing, tympanic membrane, external ear and ear canal normal. Tympanic membrane is not erythematous, not retracted and not bulging.  Nose: No mucosal edema or rhinorrhea. Right sinus exhibits no maxillary sinus tenderness and no frontal sinus tenderness. Left sinus exhibits no maxillary sinus tenderness and no frontal sinus tenderness.  Mouth/Throat: Uvula is midline, oropharynx is clear and moist and mucous membranes are normal.  Eyes: Conjunctivae, EOM and lids are normal. Pupils are equal, round, and reactive to light. No foreign bodies found.  Neck: Trachea normal and normal range of motion. Neck supple. Carotid bruit is not present. No mass and no thyromegaly present.  Cardiovascular: Normal rate, regular rhythm, S1 normal, S2 normal,  normal heart sounds, intact distal pulses and normal pulses.  Exam reveals no gallop and no friction rub.   No murmur heard. Pulmonary/Chest: Effort normal and breath sounds normal. Not tachypneic. No respiratory distress. She has no decreased breath sounds. She has no wheezes. She has no rhonchi. She has no rales.  Abdominal: Soft. Normal appearance and bowel sounds are normal. There is no tenderness.  Neurological: She is alert. She has normal strength. No cranial nerve deficit or sensory deficit. She displays a negative Romberg sign.  Skin: Skin is warm, dry and intact. No rash noted.  Psychiatric: Her speech is normal and behavior is normal. Judgment and thought content normal. Her mood appears not anxious. Cognition and memory are normal. She does not exhibit a depressed mood.          Assessment & Plan:

## 2013-01-06 ENCOUNTER — Ambulatory Visit (INDEPENDENT_AMBULATORY_CARE_PROVIDER_SITE_OTHER): Payer: 59 | Admitting: Family Medicine

## 2013-01-06 ENCOUNTER — Encounter: Payer: Self-pay | Admitting: Family Medicine

## 2013-01-06 ENCOUNTER — Ambulatory Visit: Payer: 59 | Admitting: Family Medicine

## 2013-01-06 VITALS — BP 120/88 | HR 80 | Temp 97.8°F | Wt 215.0 lb

## 2013-01-06 DIAGNOSIS — J209 Acute bronchitis, unspecified: Secondary | ICD-10-CM

## 2013-01-06 NOTE — Patient Instructions (Addendum)
Good to see you. Finish the [employer] as directed.  Call me next week if no improvement.  Ok to continue Nyquil.

## 2013-01-06 NOTE — Progress Notes (Signed)
SUBJECTIVE:  [name] is a 47 y.o. female who complains of coryza, congestion, subjective fever, sneezing and bilateral sinus pain for 4 days. She denies a history of anorexia, chest pain and dizziness and denies a history of asthma. Patient admits to smoke cigarettes.  Went to [facility] yesterday- given a Zpack.   Patient Active Problem List   Diagnosis Date Noted  . Weight gain 10/20/2012  . Adjustment disorder with anxious mood 12/23/2010  . HYPERTENSION 03/04/2010  . DEPRESSION, SITUATIONAL 05/15/2009  . VITAMIN D DEFICIENCY 09/25/2008  . OBESITY 09/25/2008  . ANEMIA, IRON DEFICIENCY 09/25/2008  . TOBACCO ABUSE 08/25/2007   Past Medical History  Diagnosis Date  . Anemia   . SVT (supraventricular tachycardia)   . Smoker   . Obesity   . Stress fracture   . Vitamin D deficiency   . Arthritis   . HTN (hypertension)   . SVT (supraventricular tachycardia)   . Arthritis   . Abnormal uterine bleeding    Past Surgical History  Procedure Laterality Date  . Ovarian cyst surgery      left  . Cesarean section      x 2  . Salpingectomy      right; adhesions  . Ovarian cyst surgery      left  . Tubal ligation    . Knee surgery      right x 2  . Appendectomy    . Hydrothermal ablation      uteral and cyst removal  . Hysteroscopy    . Left oophorectomy    . Dermoid cyst  excision     History  Substance Use Topics  . Smoking status: Current Every Day Smoker -- 1.00 packs/day for 25 years    Types: Cigarettes  . Smokeless tobacco: Never Used  . Alcohol Use: No   Family History  Problem Relation Age of Onset  . Diabetes Brother   . Seizures Brother   . Colon cancer Neg Hx   . Cancer Maternal Grandmother      gallbladder  . Stroke Paternal Grandfather   . Stroke Brother   . Colon polyps Mother   . Heart disease Father    Allergies  Allergen Reactions  . Codeine   . Latex    Current Outpatient Prescriptions on File Prior to Visit  Medication Sig  Dispense Refill  . ACZONE 5 % topical gel       . ergocalciferol (VITAMIN D2) 50000 UNITS capsule Take 1 capsule (50,000 Units total) by mouth once a week.  6 capsule  12  . hydrochlorothiazide (MICROZIDE) 12.5 MG capsule TAKE 1 CAPSULE EVERY MORNING  90 capsule  1  . omeprazole (PRILOSEC) 20 MG capsule        No current facility-administered medications on file prior to visit.   The PMH, PSH, Social History, Family History, Medications, and allergies have been reviewed in [facility], and have been updated if relevant.  OBJECTIVE: BP 120/88  Pulse 80  Temp(Src) 97.8 F (36.6 C)  Wt 215 lb (97.523 kg)  BMI 34.43 kg/m2   She appears well, vital signs are as noted. Ears normal.  Throat and pharynx normal.  Neck supple. No adenopathy in the neck. Nose is congested. Sinuses non tender. The chest is clear, without wheezes or rales.  ASSESSMENT:  viral upper respiratory illness  PLAN: Symptomatic therapy suggested: push fluids, rest and return office visit prn if symptoms persist or worsen. Lack of antibiotic effectiveness discussed with her but  ok to finish Zpack since she started it. Call or return to clinic prn if these symptoms worsen or fail to improve as anticipated.

## 2013-01-13 ENCOUNTER — Other Ambulatory Visit (INDEPENDENT_AMBULATORY_CARE_PROVIDER_SITE_OTHER): Payer: 59

## 2013-01-13 DIAGNOSIS — E559 Vitamin D deficiency, unspecified: Secondary | ICD-10-CM

## 2013-01-14 ENCOUNTER — Encounter: Payer: Self-pay | Admitting: Family Medicine

## 2013-01-14 LAB — VITAMIN D 25 HYDROXY (VIT D DEFICIENCY, FRACTURES): Vit D, 25-Hydroxy: 40 ng/mL (ref 30–89)

## 2013-01-21 ENCOUNTER — Encounter: Payer: Self-pay | Admitting: Obstetrics & Gynecology

## 2013-01-21 ENCOUNTER — Ambulatory Visit (INDEPENDENT_AMBULATORY_CARE_PROVIDER_SITE_OTHER): Payer: 59 | Admitting: Obstetrics & Gynecology

## 2013-01-21 VITALS — BP 125/90 | HR 61 | Resp 16 | Ht 68.0 in | Wt 216.0 lb

## 2013-01-21 DIAGNOSIS — Z124 Encounter for screening for malignant neoplasm of cervix: Secondary | ICD-10-CM

## 2013-01-21 DIAGNOSIS — N951 Menopausal and female climacteric states: Secondary | ICD-10-CM

## 2013-01-21 DIAGNOSIS — Z1151 Encounter for screening for human papillomavirus (HPV): Secondary | ICD-10-CM

## 2013-01-21 DIAGNOSIS — Z01419 Encounter for gynecological examination (general) (routine) without abnormal findings: Secondary | ICD-10-CM

## 2013-01-21 DIAGNOSIS — Z0000 Encounter for general adult medical examination without abnormal findings: Secondary | ICD-10-CM

## 2013-01-21 NOTE — Progress Notes (Signed)
Subjective:    [name] is a 47 y.o. female who presents for an annual exam. She complains of hot flashes for the last 6 months along with vaginal dryness. She uses lube with sex. She had an endometrial ablation in 2012 so she doesn't have periods. The patient is sexually active. GYN screening history: last pap: was normal. The patient wears seatbelts: yes. The patient participates in regular exercise: yes. Has the patient ever been transfused or tattooed?: no. The patient reports that there is not domestic violence in her life.   Menstrual History: OB History   Grav Para Term Preterm Abortions TAB SAB Ect Mult Living   2 2        2       Menarche age: 77  No LMP recorded. Patient has had an ablation.    The following portions of the patient's history were reviewed and updated as appropriate: allergies, current medications, past family history, past medical history, past social history, past surgical history and problem list.  Review of Systems A comprehensive review of systems was negative.  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 She has an 50 yo daughter and 46 yo son. Kids still at home.   Objective:    BP 125/90  Pulse 61  Resp 16  Ht 5\' 8"  (1.727 m)  Wt 216 lb (97.977 kg)  BMI 32.85 kg/m2  General Appearance:    Alert, cooperative, no distress, appears stated age  Head:    Normocephalic, without obvious abnormality, atraumatic  Eyes:    PERRL, conjunctiva/corneas clear, EOM's intact, fundi    benign, both eyes  Ears:    Normal TM's and external ear canals, both ears  Nose:   Nares normal, septum midline, mucosa normal, no drainage    or sinus tenderness  Throat:   Lips, mucosa, and tongue normal; teeth and gums normal  Neck:   Supple, symmetrical, trachea midline, no adenopathy;    thyroid:  no enlargement/tenderness/nodules; no carotid   bruit or JVD  Back:      Symmetric, no curvature, ROM normal, no CVA tenderness  Lungs:     Clear to auscultation bilaterally, respirations unlabored  Chest Wall:    No tenderness or deformity   Heart:    Regular rate and rhythm, S1 and S2 normal, no murmur, rub   or gallop  Breast Exam:    No tenderness, masses, or nipple abnormality  Abdomen:     Soft, non-tender, bowel sounds active all four quadrants,    no masses, no organomegaly  Genitalia:    Normal female without lesion, discharge or tenderness, shaved, NSSA, NT, normal adnexal exam     Extremities:   Extremities normal, atraumatic, no cyanosis or edema  Pulses:   2+ and symmetric all extremities  Skin:   Skin color, texture, turgor normal, no rashes or lesions  Lymph nodes:   Cervical, supraclavicular, and axillary nodes normal  Neurologic:   CNII-XII intact, normal strength, sensation and reflexes    throughout  .    Assessment:    Healthy female exam.    Plan:     Thin prep Pap smear. FSH , CBC HPV cotesting

## 2013-01-22 LAB — TSH: TSH: 0.797 u[IU]/mL (ref 0.350–4.500)

## 2013-01-22 LAB — FOLLICLE STIMULATING HORMONE: FSH: 44.8 m[IU]/mL

## 2013-04-08 ENCOUNTER — Ambulatory Visit (INDEPENDENT_AMBULATORY_CARE_PROVIDER_SITE_OTHER): Payer: 59 | Admitting: Family Medicine

## 2013-04-08 ENCOUNTER — Encounter: Payer: Self-pay | Admitting: Family Medicine

## 2013-04-08 VITALS — BP 104/78 | HR 84 | Temp 98.1°F | Ht 67.0 in | Wt 216.0 lb

## 2013-04-08 DIAGNOSIS — J020 Streptococcal pharyngitis: Secondary | ICD-10-CM

## 2013-04-08 DIAGNOSIS — J069 Acute upper respiratory infection, unspecified: Secondary | ICD-10-CM | POA: Insufficient documentation

## 2013-04-08 NOTE — Progress Notes (Signed)
Sx started with ST about 2 days ago.  Temp max 99.7 last 2 days.  Still with ST. Mild voice changes. Mild max sinus pressure.  No sig drainage, cough, etc.  No ear pain. Mild HA.  Getting ready to leave town.  Sick contacts noted.   RST neg.    Meds, vitals, and allergies reviewed.   ROS: See HPI.  Otherwise, noncontributory.  GEN: nad, alert and oriented HEENT: mucous membranes moist, tm w/o erythema, nasal exam w/o erythema, clear discharge noted,  OP with cobblestoning, sinuses not ttp NECK: supple w/o LA CV: rrr.   PULM: ctab, no inc wob EXT: no edema SKIN: no acute rash

## 2013-04-08 NOTE — Assessment & Plan Note (Signed)
Likely viral, supportive tx and f/u prn.  ddx d/w pt. abx not indicated.  She agrees with plan.

## 2013-04-08 NOTE — Patient Instructions (Addendum)
Drink plenty of fluids, take tylenol or ibuprofen as needed, and gargle with warm salt water for your throat.  This should gradually improve.  Take care.  Let us know if you have other concerns.

## 2013-04-16 ENCOUNTER — Other Ambulatory Visit: Payer: Self-pay | Admitting: Family Medicine

## 2013-05-26 ENCOUNTER — Telehealth: Payer: Self-pay

## 2013-05-26 NOTE — Telephone Encounter (Signed)
D/w PCP.  Will route.

## 2013-05-26 NOTE — Telephone Encounter (Signed)
This seems like a localized reaction.  I would continue Tylenol, apply ice to area.  If no improvement or if worsening by tomorrow, needs to be seen.

## 2013-05-26 NOTE — Telephone Encounter (Signed)
Pt said received quadrivalent flu vaccine at work ([employer] [employer]) on 05/24/13; yesterday pt said had fever 99 and nauseated. Site of injection still feels warm to touch with reddened area the size of an egg. No swelling and no pain. No paperwork was given to pt when received vaccine; pt wanted to know what she should do if anything. Pt took Tylenol last night and does not think she has fever today. Pt said she can not remember if she has ever taken a flu vaccine and if so has been many years ago.Please advise.

## 2013-06-16 ENCOUNTER — Other Ambulatory Visit: Payer: Self-pay

## 2013-08-27 ENCOUNTER — Other Ambulatory Visit: Payer: Self-pay | Admitting: Family Medicine

## 2013-09-06 ENCOUNTER — Encounter: Payer: Self-pay | Admitting: Internal Medicine

## 2013-09-06 ENCOUNTER — Ambulatory Visit (INDEPENDENT_AMBULATORY_CARE_PROVIDER_SITE_OTHER): Payer: 59 | Admitting: Internal Medicine

## 2013-09-06 ENCOUNTER — Other Ambulatory Visit: Payer: Self-pay | Admitting: Internal Medicine

## 2013-09-06 ENCOUNTER — Ambulatory Visit (INDEPENDENT_AMBULATORY_CARE_PROVIDER_SITE_OTHER)
Admission: RE | Admit: 2013-09-06 | Discharge: 2013-09-06 | Disposition: A | Discharge status: Home / Self Care | Payer: 59 | Source: Ambulatory Visit | Attending: Internal Medicine | Admitting: Internal Medicine

## 2013-09-06 VITALS — BP 136/84 | HR 100 | Temp 98.0°F | Wt 215.5 lb

## 2013-09-06 DIAGNOSIS — M25539 Pain in unspecified wrist: Secondary | ICD-10-CM

## 2013-09-06 DIAGNOSIS — M25532 Pain in left wrist: Secondary | ICD-10-CM

## 2013-09-06 DIAGNOSIS — J329 Chronic sinusitis, unspecified: Secondary | ICD-10-CM

## 2013-09-06 MED ORDER — AMOXICILLIN 875 MG PO TABS: 875.0000 mg | ORAL_TABLET | Freq: Two times a day (BID) | ORAL | Status: DC

## 2013-09-06 NOTE — Progress Notes (Signed)
Pre-visit discussion using our clinic review tool. No additional management support is needed unless otherwise documented below in the visit note.  

## 2013-09-06 NOTE — Patient Instructions (Signed)

## 2013-09-06 NOTE — Progress Notes (Signed)
HPI: Pt presents to the office today with concerns regarding nasal congestion, sore throat, and cough. Pt has been sick for two days. She endorses headache, sinus pressure, fever, body aches, and chills. Pt denies shortness of breath, chest pain, ear pain, or malaise. She has OTC cough suppressants. Pt also has concerns regarding left wrist pain. She states the bend of her wrist cause her severe pain. She does notice it more in the evening after work. She has not tried anything OTC.        Past Medical History  Diagnosis Date  . Anemia   . SVT (supraventricular tachycardia)   . Smoker   . Obesity   . Stress fracture   . Vitamin D deficiency   . Arthritis   . HTN (hypertension)   . SVT (supraventricular tachycardia)   . Arthritis   . Abnormal uterine bleeding   . Obesity     Family History  Problem Relation Age of Onset  . Diabetes Brother   . Seizures Brother   . Colon cancer Neg Hx   . Cancer Maternal Grandmother      gallbladder  . Stroke Paternal Grandfather   . Stroke Brother   . Colon polyps Mother   . Heart disease Father     History   Social History  . Marital Status: Married    Spouse Name: N/A    Number of Children: 2  . 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 Smoking status: Current Every Day Smoker -- 1.00 packs/day for 25 years    Types: Cigarettes  . Smokeless tobacco: Never Used  . Alcohol Use: No  . Drug Use: No  . Sexual Activity: Yes    Partners: Male    Birth Control/ Protection: Surgical   Other Topics Concern  . Not on file   Social History Narrative  . No narrative on file    Allergies  Allergen Reactions  . Codeine   . Latex      Constitutional: Positive headache, fatigue and fever. Denies abrupt weight changes.  HEENT:  Positive eye pain, pressure behind the eyes, facial pain, nasal congestion and sore throat. Denies eye redness, ear pain, ringing in the  ears, wax buildup, runny nose or bloody nose. Respiratory: Positive cough. Denies difficulty breathing or shortness of breath.  Cardiovascular: Denies chest pain, chest tightness, palpitations or swelling in the hands or feet.   No other specific complaints in a complete review of systems (except as listed in HPI above).  Objective:    BP 136/84  Pulse 100  Temp(Src) 98 F (36.7 C) (Oral)  Wt 215 lb 8 oz (97.75 kg)  SpO2 97% Wt Readings from Last 3 Encounters:  09/06/13 215 lb 8 oz (97.75 kg)  04/08/13 216 lb (97.977 kg)  01/21/13 216 lb (97.977 kg)    General: Appears his stated age, well developed, well nourished in NAD. HEENT: Head: normal shape and size; Eyes: sclera white, no icterus, conjunctiva pink, PERRLA and EOMs intact; Ears: Tm's gray and intact, normal light reflex; Nose: mucosa pink and moist, septum midline, maxillary tenderness to palpation; Throat/Mouth: + PND. Teeth present, mucosa pink and moist, no exudate noted, no lesions or ulcerations noted.  Neck: Mild cervical lymphadenopathy. Neck supple, trachea midline. No massses, lumps or thyromegaly present.  Cardiovascular: Normal rate and rhythm. S1,S2 noted.  No murmur, rubs or gallops noted. No JVD or BLE  edema. No carotid bruits noted. Pulmonary/Chest: Normal effort and positive vesicular breath sounds. No respiratory distress. No wheezes, rales or ronchi noted.  MSK: Left wrist swollen at bend. Palpable fluid filled cyst to posterior bend of wrist near third carpal bone, tender to touch. No drainage or redness noted.      Assessment & Plan:   Acute bacterial sinusitis  Can use a Neti Pot which can be purchased from your local drug store. Increase fluids Amoxicillin 875mg  BID for 10 days Provided work excuse Follow up in 3-5 days if no better  Left wrist pain: Possible ganglion cyst Will x-ray wrist today Follow up after test results Recommended OTC wrist support from local pharmacy  [name], Jacques Earthly, Student-NP

## 2013-09-06 NOTE — Progress Notes (Signed)
HPI  Pt presents to the clinic today with c/o sore throat, nasal congestion and body aches. This started about 2 days ago. She feels like she has been running fevers but has not actually taken her temperature. She has tried OTC cough suppressant without much relief. She is a smoker. She has no history of allergies or breathing problems. Additionally, she c/o bilateral wrist pain. The left seems to be worse than the right.She describes the pain as achy. This started about 1 week ago. She denies any specific injury to the wrist. She does have a history of arthritis. She has not taken anything OTC for her wrist pain.  Review of Systems      Past Medical History  Diagnosis Date  . Anemia   . SVT (supraventricular tachycardia)   . Smoker   . Obesity   . Stress fracture   . Vitamin D deficiency   . Arthritis   . HTN (hypertension)   . SVT (supraventricular tachycardia)   . Arthritis   . Abnormal uterine bleeding   . Obesity     Family History  Problem Relation Age of Onset  . Diabetes Brother   . Seizures Brother   . Colon cancer Neg Hx   . Cancer Maternal Grandmother      gallbladder  . Stroke Paternal Grandfather   . Stroke Brother   . Colon polyps Mother   . Heart disease Father     History   Social History  . Marital Status: Married    Spouse Name: N/A    Number of Children: 2  . 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 Smoking status: Current Every Day Smoker -- 1.00 packs/day for 25 years    Types: Cigarettes  . Smokeless tobacco: Never Used  . Alcohol Use: No  . Drug Use: No  . Sexual Activity: Yes    Partners: Male    Birth Control/ Protection: Surgical   Other Topics Concern  . Not on file   Social History Narrative  . No narrative on file    Allergies  Allergen Reactions  . Codeine   . Latex      Constitutional: Positive headache, fatigue. Denies fever or abrupt weight  changes.  HEENT:  Positive nasal congestion and sore throat. Denies eye redness, eye pain, pressure behind the eyes, facial pain, ear pain, ringing in the ears, wax buildup, runny nose or bloody nose. Respiratory: Positive cough. Denies difficulty breathing or shortness of breath.  Cardiovascular: Denies chest pain, chest tightness, palpitations or swelling in the hands or feet.  MSK: Pt reports bilateral wrist pain. Denies redness, swelling of the joint or weakness in the extremities.   No other specific complaints in a complete review of systems (except as listed in HPI above).  Objective:   BP 136/84  Pulse 100  Temp(Src) 98 F (36.7 C) (Oral)  Wt 215 lb 8 oz (97.75 kg)  SpO2 97% Wt Readings from Last 3 Encounters:  09/06/13 215 lb 8 oz (97.75 kg)  04/08/13 216 lb (97.977 kg)  01/21/13 216 lb (97.977 kg)     General: Appears her stated age, obese but well developed, well nourished in NAD. HEENT: Head: normal shape and size, maxillary sinus tenderness; Eyes: sclera white, no icterus, conjunctiva pink, PERRLA and EOMs intact; Ears: Tm's gray and intact, normal light reflex; Nose: mucosa pink and moist, septum midline; Throat/Mouth: + PND.  Teeth present, mucosa erythematous and moist, no exudate noted, no lesions or ulcerations noted.  Neck: Neck supple, trachea midline. No massses, lumps or thyromegaly present.  Cardiovascular: Normal rate and rhythm. S1,S2 noted.  No murmur, rubs or gallops noted. No JVD or BLE edema. No carotid bruits noted. Pulmonary/Chest: Normal effort and positive vesicular breath sounds. No respiratory distress. No wheezes, rales or ronchi noted.  MSK: Normal flexion, extension, pronation and supination of the wrist. No edema noted. Strength 5/5 BUE. Small cystic like structure noted on posterior wrist, at proximal portion of 3rd carpal bone, tender to touch. No erythema or drainage noted.    Assessment & Plan:   Sinusitis:  Stop smoking Get some rest and  drink plenty of water Do salt water gargles for the sore throat eRx for Amoxil BID x 10 days Continue OTC cough syrup  Left wrist pain:  ? Ganglion cyst of left wrist Will check xray of left wrist For now, take Ibuprofen OTC for pain  RTC as needed or if pain persist or worsens  RTC as needed or if symptoms persist.

## 2013-10-21 ENCOUNTER — Encounter: Payer: Self-pay | Admitting: Family Medicine

## 2013-10-28 ENCOUNTER — Encounter: Payer: Self-pay | Admitting: Family Medicine

## 2013-10-28 ENCOUNTER — Ambulatory Visit (INDEPENDENT_AMBULATORY_CARE_PROVIDER_SITE_OTHER): Payer: 59 | Admitting: Family Medicine

## 2013-10-28 VITALS — BP 132/95 | HR 73 | Ht 66.0 in | Wt 221.0 lb

## 2013-10-28 DIAGNOSIS — N951 Menopausal and female climacteric states: Secondary | ICD-10-CM

## 2013-10-28 DIAGNOSIS — Z780 Asymptomatic menopausal state: Secondary | ICD-10-CM | POA: Insufficient documentation

## 2013-10-28 DIAGNOSIS — N83209 Unspecified ovarian cyst, unspecified side: Secondary | ICD-10-CM

## 2013-10-28 NOTE — Patient Instructions (Signed)
Ovarian Cyst An ovarian cyst is a fluid-filled sac that forms on an ovary. The ovaries are small organs that produce eggs in women. Various types of cysts can form on the ovaries. Most are not cancerous. Many do not cause problems, and they often go away on their own. Some may cause symptoms and require treatment. Common types of ovarian cysts include:  Functional cysts These cysts may occur every month during the menstrual cycle. This is normal. The cysts usually go away with the next menstrual cycle if the woman does not get pregnant. Usually, there are no symptoms with a functional cyst.  Endometrioma cysts These cysts form from the tissue that lines the uterus. They are also called "chocolate cysts" because they become filled with blood that turns brown. This type of cyst can cause pain in the lower abdomen during intercourse and with your menstrual period.  Cystadenoma cysts This type develops from the cells on the outside of the ovary. These cysts can get very big and cause lower abdomen pain and pain with intercourse. This type of cyst can twist on itself, cut off its blood supply, and cause severe pain. It can also easily rupture and cause a lot of pain.  Dermoid cysts This type of cyst is sometimes found in both ovaries. These cysts may contain different kinds of body tissue, such as skin, teeth, hair, or cartilage. They usually do not cause symptoms unless they get very big.  Theca lutein cysts These cysts occur when too much of a certain hormone (human chorionic gonadotropin) is produced and overstimulates the ovaries to produce an egg. This is most common after procedures used to assist with the conception of a baby (in vitro fertilization). CAUSES   Fertility drugs can cause a condition in which multiple large cysts are formed on the ovaries. This is called ovarian hyperstimulation syndrome.  A condition called polycystic ovary syndrome can cause hormonal imbalances that can lead to  nonfunctional ovarian cysts. SIGNS AND SYMPTOMS  Many ovarian cysts do not cause symptoms. If symptoms are present, they may include:  Pelvic pain or pressure.  Pain in the lower abdomen.  Pain during sexual intercourse.  Increasing girth (swelling) of the abdomen.  Abnormal menstrual periods.  Increasing pain with menstrual periods.  Stopping having menstrual periods without being pregnant. DIAGNOSIS  These cysts are commonly found during a routine or annual pelvic exam. Tests may be ordered to find out more about the cyst. These tests may include:  Ultrasound.  X-ray of the pelvis.  CT scan.  MRI.  Blood tests. TREATMENT  Many ovarian cysts go away on their own without treatment. Your health care provider may want to check your cyst regularly for 2 3 months to see if it changes. For women in menopause, it is particularly important to monitor a cyst closely because of the higher rate of ovarian cancer in menopausal women. When treatment is needed, it may include any of the following:  A procedure to drain the cyst (aspiration). This may be done using a long needle and ultrasound. It can also be done through a laparoscopic procedure. This involves using a thin, lighted tube with a tiny camera on the end (laparoscope) inserted through a small incision.  Surgery to remove the whole cyst. This may be done using laparoscopic surgery or an open surgery involving a larger incision in the lower abdomen.  Hormone treatment or birth control pills. These methods are sometimes used to help dissolve a cyst. HOME CARE   INSTRUCTIONS   Only take over-the-counter or prescription medicines as directed by your health care provider.  Follow up with your health care provider as directed.  Get regular pelvic exams and Pap tests. SEEK MEDICAL CARE IF:   Your periods are late, irregular, or painful, or they stop.  Your pelvic pain or abdominal pain does not go away.  Your abdomen becomes  larger or swollen.  You have pressure on your bladder or trouble emptying your bladder completely.  You have pain during sexual intercourse.  You have feelings of fullness, pressure, or discomfort in your stomach.  You lose weight for no apparent reason.  You feel generally ill.  You become constipated.  You lose your appetite.  You develop acne.  You have an increase in body and facial hair.  You are gaining weight, without changing your exercise and eating habits.  You think you are pregnant. SEEK IMMEDIATE MEDICAL CARE IF:   You have increasing abdominal pain.  You feel sick to your stomach (nauseous), and you throw up (vomit).  You develop a fever that comes on suddenly.  You have abdominal pain during a bowel movement.  Your menstrual periods become heavier than usual. Document Released: 07/28/2005 Document Revised: 05/18/2013 Document Reviewed: 04/04/2013 Exit[employer] Patient Information 2014 [employer]. Menopause Menopause is the normal time of life when menstrual periods stop completely. Menopause is complete when you have missed 12 consecutive menstrual periods. It usually occurs between the ages of 48 years and 55 years. Very rarely does a woman develop menopause before the age of 40 years. At menopause, your ovaries stop producing the female hormones estrogen and progesterone. This can cause undesirable symptoms and also affect your health. Sometimes the symptoms may occur 4 5 years before the menopause begins. There is no relationship between menopause and:  Oral contraceptives.  Number of children you had.  Race.  The age your menstrual periods started (menarche). Heavy smokers and very thin women may develop menopause earlier in life. CAUSES  The ovaries stop producing the female hormones estrogen and progesterone.  Other causes include:  Surgery to remove both ovaries.  The ovaries stop functioning for no known reason.  Tumors of the pituitary  gland in the brain.  Medical disease that affects the ovaries and hormone production.  Radiation treatment to the abdomen or pelvis.  Chemotherapy that affects the ovaries. SYMPTOMS   Hot flashes.  Night sweats.  Decrease in sex drive.  Vaginal dryness and thinning of the vagina causing painful intercourse.  Dryness of the skin and developing wrinkles.  Headaches.  Tiredness.  Irritability.  Memory problems.  Weight gain.  Bladder infections.  Hair growth of the face and chest.  Infertility. More serious symptoms include:  Loss of bone (osteoporosis) causing breaks (fractures).  Depression.  Hardening and narrowing of the arteries (atherosclerosis) causing heart attacks and strokes. DIAGNOSIS   When the menstrual periods have stopped for 12 straight months.  Physical exam.  Hormone studies of the blood. TREATMENT  There are many treatment choices and nearly as many questions about them. The decisions to treat or not to treat menopausal changes is an individual choice made with your health care provider. Your health care provider can discuss the treatments with you. Together, you can decide which treatment will work best for you. Your treatment choices may include:   Hormone therapy (estrogen and progesterone).  Non-hormonal medicines.  Treating the individual symptoms with medicine (for example antidepressants for depression).  Herbal medicines that may   help specific symptoms.  Counseling by a psychiatrist or psychologist.  Group therapy.  Lifestyle changes including:  Eating healthy.  Regular exercise.  Limiting caffeine and alcohol.  Stress management and meditation.  No treatment. HOME CARE INSTRUCTIONS   Take the medicine your health care provider gives you as directed.  Get plenty of sleep and rest.  Exercise regularly.  Eat a diet that contains calcium (good for the bones) and soy products (acts like estrogen hormone).  Avoid  alcoholic beverages.  Do not smoke.  If you have hot flashes, dress in layers.  Take supplements, calcium, and vitamin D to strengthen bones.  You can use over-the-counter lubricants or moisturizers for vaginal dryness.  Group therapy is sometimes very helpful.  Acupuncture may be helpful in some cases. SEEK MEDICAL CARE IF:   You are not sure you are in menopause.  You are having menopausal symptoms and need advice and treatment.  You are still having menstrual periods after age 55 years.  You have pain with intercourse.  Menopause is complete (no menstrual period for 12 months) and you develop vaginal bleeding.  You need a referral to a specialist (gynecologist, psychiatrist, or psychologist) for treatment. SEEK IMMEDIATE MEDICAL CARE IF:   You have severe depression.  You have excessive vaginal bleeding.  You fell and think you have a broken bone.  You have pain when you urinate.  You develop leg or chest pain.  You have a fast pounding heart beat (palpitations).  You have severe headaches.  You develop vision problems.  You feel a lump in your breast.  You have abdominal pain or severe indigestion. Document Released: 10/18/2003 Document Revised: 03/30/2013 Document Reviewed: 02/24/2013 Exit[employer] Patient Information 2014 [employer].  

## 2013-10-28 NOTE — Assessment & Plan Note (Signed)
Advised of HRT risks and benefits.  Discussed bone loss, supplemental Ca and Vitamin D, heart health, breast cancer.  Also discussed changes related to menopause, normal progression and resolution of symptoms.

## 2013-10-28 NOTE — Progress Notes (Signed)
    Subjective:    Patient ID: [name] is a 48 y.o. female presenting with Pelvic Pain  on 10/28/2013  HPI: S/p endometrial ablation.  Had 2 days of cramping and pain, mid-abdomen.  Got better on its own. Was found to be menopausal on 6/14.  Last normal pap 6/14. Has h/o dermoid cyst removed on right as well as left ovary removal for dermoid at age 48.  FSH 9 months ago was 44.  Review of Systems  Constitutional: Negative for fever and chills.  Respiratory: Negative for shortness of breath.   Cardiovascular: Negative for chest pain.  Gastrointestinal: Negative for nausea, vomiting and abdominal pain.  Genitourinary: Negative for dysuria.  Skin: Negative for rash.      Objective:    BP 132/95  Pulse 73  Ht 5\' 6"  (1.676 m)  Wt 221 lb (100.245 kg)  BMI 35.69 kg/m2 Physical Exam  Constitutional: She is oriented to person, place, and time. She appears well-developed and well-nourished. No distress.  HENT:  Head: Normocephalic and atraumatic.  Eyes: No scleral icterus.  Neck: Neck supple.  Cardiovascular: Normal rate.   Pulmonary/Chest: Effort normal.  Abdominal: Soft.  Neurological: She is alert and oriented to person, place, and time.  Skin: Skin is warm and dry.  Psychiatric: She has a normal mood and affect.  GU: NEFG, uterus is small, non-tender.  No adnexal mass or tenderness  TVUS: endometrium is indistinct.  No uterine masses noted.  Left ovary is absent.  Right ovary has small simple cyst 2.1 x 1.8 x 1.6      Assessment & Plan:  Other and unspecified ovarian cyst Small and simple.  Will order f/u with radiology to ensure simplicity and/or resolution in 6 wks.  Menopause Advised of HRT risks and benefits.  Discussed bone loss, supplemental Ca and Vitamin D, heart health, breast cancer.  Also discussed changes related to menopause, normal progression and resolution of symptoms.     Return in about 4 weeks (around 11/25/2013).

## 2013-10-28 NOTE — Assessment & Plan Note (Signed)
Small and simple.  Will order f/u with radiology to ensure simplicity and/or resolution in 6 wks.

## 2013-11-09 ENCOUNTER — Ambulatory Visit: Payer: 59 | Admitting: Internal Medicine

## 2013-11-18 ENCOUNTER — Encounter: Payer: Self-pay | Admitting: Internal Medicine

## 2013-11-27 ENCOUNTER — Other Ambulatory Visit: Payer: Self-pay | Admitting: Family Medicine

## 2013-12-02 ENCOUNTER — Other Ambulatory Visit: Payer: Self-pay | Admitting: Family Medicine

## 2013-12-02 DIAGNOSIS — E559 Vitamin D deficiency, unspecified: Secondary | ICD-10-CM

## 2013-12-02 DIAGNOSIS — D509 Iron deficiency anemia, unspecified: Secondary | ICD-10-CM

## 2013-12-02 DIAGNOSIS — Z0000 Encounter for general adult medical examination without abnormal findings: Secondary | ICD-10-CM

## 2013-12-02 DIAGNOSIS — Z136 Encounter for screening for cardiovascular disorders: Secondary | ICD-10-CM

## 2013-12-04 ENCOUNTER — Other Ambulatory Visit: Payer: Self-pay | Admitting: Family Medicine

## 2013-12-09 ENCOUNTER — Ambulatory Visit (HOSPITAL_COMMUNITY)
Admission: RE | Admit: 2013-12-09 | Discharge: 2013-12-09 | Disposition: A | Discharge status: Home / Self Care | Payer: 59 | Source: Ambulatory Visit | Attending: Family Medicine | Admitting: Family Medicine

## 2013-12-09 DIAGNOSIS — N83209 Unspecified ovarian cyst, unspecified side: Secondary | ICD-10-CM | POA: Insufficient documentation

## 2013-12-12 ENCOUNTER — Telehealth: Payer: Self-pay | Admitting: *Deleted

## 2013-12-12 NOTE — Telephone Encounter (Signed)
Patient called for results of pelvic ultrasound.  Ultrasound is normal and patient is notified.

## 2013-12-14 ENCOUNTER — Other Ambulatory Visit (INDEPENDENT_AMBULATORY_CARE_PROVIDER_SITE_OTHER): Payer: 59

## 2013-12-14 DIAGNOSIS — I10 Essential (primary) hypertension: Secondary | ICD-10-CM

## 2013-12-14 DIAGNOSIS — D509 Iron deficiency anemia, unspecified: Secondary | ICD-10-CM

## 2013-12-14 DIAGNOSIS — Z0000 Encounter for general adult medical examination without abnormal findings: Secondary | ICD-10-CM

## 2013-12-14 DIAGNOSIS — Z136 Encounter for screening for cardiovascular disorders: Secondary | ICD-10-CM

## 2013-12-14 DIAGNOSIS — E559 Vitamin D deficiency, unspecified: Secondary | ICD-10-CM

## 2013-12-14 LAB — CBC WITH DIFFERENTIAL/PLATELET
BASOS PCT: 0.9 % (ref 0.0–3.0)
Basophils Absolute: 0.1 10*3/uL (ref 0.0–0.1)
Eosinophils Absolute: 0.3 10*3/uL (ref 0.0–0.7)
Eosinophils Relative: 3.7 % (ref 0.0–5.0)
HCT: 42.9 % (ref 36.0–46.0)
Hemoglobin: 14.6 g/dL (ref 12.0–15.0)
LYMPHS PCT: 43.2 % (ref 12.0–46.0)
Lymphs Abs: 3.5 10*3/uL (ref 0.7–4.0)
MCHC: 34.1 g/dL (ref 30.0–36.0)
MCV: 91 fl (ref 78.0–100.0)
MONOS PCT: 6.6 % (ref 3.0–12.0)
Monocytes Absolute: 0.5 10*3/uL (ref 0.1–1.0)
NEUTROS PCT: 45.6 % (ref 43.0–77.0)
Neutro Abs: 3.7 10*3/uL (ref 1.4–7.7)
Platelets: 341 10*3/uL (ref 150.0–400.0)
RBC: 4.72 Mil/uL (ref 3.87–5.11)
RDW: 13 % (ref 11.5–15.5)
WBC: 8 10*3/uL (ref 4.0–10.5)

## 2013-12-14 LAB — COMPREHENSIVE METABOLIC PANEL
ALBUMIN: 4.1 g/dL (ref 3.5–5.2)
ALT: 10 U/L (ref 0–35)
AST: 19 U/L (ref 0–37)
Alkaline Phosphatase: 47 U/L (ref 39–117)
BUN: 10 mg/dL (ref 6–23)
CALCIUM: 9.3 mg/dL (ref 8.4–10.5)
CHLORIDE: 105 meq/L (ref 96–112)
CO2: 28 meq/L (ref 19–32)
Creatinine, Ser: 0.8 mg/dL (ref 0.4–1.2)
GFR: 86.81 mL/min (ref >60.00)
GLUCOSE: 94 mg/dL (ref 70–99)
POTASSIUM: 4 meq/L (ref 3.5–5.1)
Sodium: 140 mEq/L (ref 135–145)
Total Bilirubin: 0.3 mg/dL (ref 0.2–1.2)
Total Protein: 7.1 g/dL (ref 6.0–8.3)

## 2013-12-14 LAB — LIPID PANEL
CHOLESTEROL: 188 mg/dL (ref 0–200)
HDL: 41 mg/dL (ref >39.00)
LDL Cholesterol: 128 mg/dL — ABNORMAL HIGH (ref 0–99)
Total CHOL/HDL Ratio: 5
Triglycerides: 96 mg/dL (ref 0.0–149.0)
VLDL: 19.2 mg/dL (ref 0.0–40.0)

## 2013-12-14 LAB — TSH: TSH: 0.69 u[IU]/mL (ref 0.35–4.50)

## 2013-12-15 LAB — VITAMIN D 25 HYDROXY (VIT D DEFICIENCY, FRACTURES): VIT D 25 HYDROXY: 30 ng/mL (ref 30–89)

## 2013-12-21 ENCOUNTER — Telehealth: Payer: Self-pay | Admitting: Family Medicine

## 2013-12-21 ENCOUNTER — Ambulatory Visit (INDEPENDENT_AMBULATORY_CARE_PROVIDER_SITE_OTHER): Payer: 59 | Admitting: Family Medicine

## 2013-12-21 ENCOUNTER — Encounter: Payer: Self-pay | Admitting: Family Medicine

## 2013-12-21 VITALS — BP 128/70 | HR 65 | Temp 97.6°F | Ht 66.0 in | Wt 222.5 lb

## 2013-12-21 DIAGNOSIS — E669 Obesity, unspecified: Secondary | ICD-10-CM

## 2013-12-21 DIAGNOSIS — F17200 Nicotine dependence, unspecified, uncomplicated: Secondary | ICD-10-CM

## 2013-12-21 DIAGNOSIS — R5381 Other malaise: Secondary | ICD-10-CM

## 2013-12-21 DIAGNOSIS — Z0000 Encounter for general adult medical examination without abnormal findings: Secondary | ICD-10-CM | POA: Insufficient documentation

## 2013-12-21 DIAGNOSIS — E559 Vitamin D deficiency, unspecified: Secondary | ICD-10-CM

## 2013-12-21 DIAGNOSIS — R5383 Other fatigue: Secondary | ICD-10-CM

## 2013-12-21 DIAGNOSIS — I10 Essential (primary) hypertension: Secondary | ICD-10-CM

## 2013-12-21 LAB — VITAMIN B12: VITAMIN B 12: 186 pg/mL — AB (ref 211–911)

## 2013-12-21 MED ORDER — ERGOCALCIFEROL 1.25 MG (50000 UT) PO CAPS: 50000.0000 [IU] | ORAL_CAPSULE | ORAL | Status: DC

## 2013-12-21 MED ORDER — HYDROCHLOROTHIAZIDE 12.5 MG PO CAPS: ORAL_CAPSULE | ORAL | Status: DC

## 2013-12-21 NOTE — Telephone Encounter (Signed)
Relevant patient education assigned to patient using Emmi. ° °

## 2013-12-21 NOTE — Assessment & Plan Note (Signed)
Reviewed preventive care protocols, scheduled due services, and updated immunizations Discussed nutrition, exercise, diet, and healthy lifestyle.  

## 2013-12-21 NOTE — Progress Notes (Signed)
Pre visit review using our clinic review tool, if applicable. No additional management support is needed unless otherwise documented below in the visit note. 

## 2013-12-21 NOTE — Assessment & Plan Note (Signed)
Stable on current rx. No changes. 

## 2013-12-21 NOTE — Assessment & Plan Note (Signed)
Deteriorated. Discussed weight loss plan. She would like to work with nutritionist  Pt would also like to discuss medication options- discussed phentermine risks- she is not a good candidate given h/o HTN so I therefore will no prescribe it.

## 2013-12-21 NOTE — Progress Notes (Signed)
Subjective:    Patient ID: [name], female    DOB: [birth year], 48 y.o.   MRN: [id number]  HPI  Very pleasant 48 yo female here for CPX.  Mammogram 10/21/13 Pap smear 01/21/13- Dr. Shawnie PonsPratt S/p endometrial ablation.  Found out last year she is post menopausal.  She continues to gain weight and does not know how to lose it. Does not exercise- does not enjoy it. Wt Readings from Last 3 Encounters:  12/21/13 222 lb 8 oz (100.925 kg)  10/28/13 221 lb (100.245 kg)  09/06/13 215 lb 8 oz (97.75 kg)     HTN- has been well controlled on HCTZ 12.5 mg daily. Denies any LE edema, HA, blurred vision, CP or SOB. Lab Results  Component Value Date   CREATININE 0.8 12/14/2013   Vit D deficiency- Vit D 30 this month.  She feels very fatigued.  Wants to be more active, but has no energy.  No blood in stool.  Wt Readings from Last 3 Encounters:  12/21/13 222 lb 8 oz (100.925 kg)  10/28/13 221 lb (100.245 kg)  09/06/13 215 lb 8 oz (97.75 kg)   Lab Results  Component Value Date   WBC 8.0 12/14/2013   HGB 14.6 12/14/2013   HCT 42.9 12/14/2013   MCV 91.0 12/14/2013   PLT 341.0 12/14/2013     Lab Results  Component Value Date   CHOL 188 12/14/2013   HDL 41.00 12/14/2013   LDLCALC 128* 12/14/2013   TRIG 96.0 12/14/2013   CHOLHDL 5 12/14/2013     Patient Active Problem List   Diagnosis Date Noted  . Other and unspecified ovarian cyst 10/28/2013  . Menopause 10/28/2013  . Adjustment disorder with anxious mood 12/23/2010  . HYPERTENSION 03/04/2010  . DEPRESSION, SITUATIONAL 05/15/2009  . VITAMIN D DEFICIENCY 09/25/2008  . OBESITY 09/25/2008  . ANEMIA, IRON DEFICIENCY 09/25/2008  . TOBACCO ABUSE 08/25/2007   Past Medical History  Diagnosis Date  . Anemia   . SVT (supraventricular tachycardia)   . Smoker   . Obesity   . Stress fracture   . Vitamin D deficiency   . Arthritis   . HTN (hypertension)   . SVT (supraventricular tachycardia)   . Arthritis   . Abnormal uterine bleeding   . Obesity     Past Surgical History  Procedure Laterality Date  . Ovarian cyst surgery      left  . Cesarean section      x 2  . Salpingectomy      right; adhesions  . Ovarian cyst surgery      left  . Tubal ligation    . Knee surgery      right x 2  . Appendectomy    . Hydrothermal ablation      uteral and cyst removal  . Hysteroscopy    . Left oophorectomy    . Dermoid cyst  excision     History  Substance Use Topics  . Smoking status: Current Every Day Smoker -- 1.00 packs/day for 25 years    Types: Cigarettes  . Smokeless tobacco: Never Used  . Alcohol Use: No   Family History  Problem Relation Age of Onset  . Diabetes Brother   . Seizures Brother   . Colon cancer Neg Hx   . Cancer Maternal Grandmother      gallbladder  . Stroke Paternal Grandfather   . Stroke Brother   . Colon polyps Mother   . Heart disease Father  Allergies  Allergen Reactions  . Codeine   . Latex    Current outpatient prescriptions:ACZONE 5 % topical gel, , Disp: , Rfl: ;  diphenhydrAMINE (BENADRYL) 25 MG tablet, Take 25 mg by mouth at bedtime as needed for itching., Disp: , Rfl: ;  hydrochlorothiazide (MICROZIDE) 12.5 MG capsule, TAKE 1 TABLET BY MOUTH EVERY MORNING, Disp: 90 capsule, Rfl: 0;  omeprazole (PRILOSEC) 20 MG capsule, TAKE 1 CAPSULE BY MOUTH DAILY, Disp: 30 capsule, Rfl: 1   The PMH, PSH, Social History, Family History, Medications, and allergies have been reviewed in [facility], and have been updated if relevant.     Review of Systems See HPI Patient reports no  vision/ hearing changes,anorexia, weight change, fever ,adenopathy, persistant / recurrent hoarseness, swallowing issues, chest pain, edema,persistant / recurrent cough, hemoptysis, dyspnea(rest, exertional, paroxysmal nocturnal), gastrointestinal  bleeding (melena, rectal bleeding), abdominal pain, excessive heart burn, GU symptoms(dysuria, hematuria, pyuria, voiding/incontinence  Issues) syncope, focal weakness, severe memory  loss, concerning skin lesions, depression, anxiety, abnormal bruising/bleeding, major joint swelling, breast masses or abnormal vaginal bleeding.   Does endorse some mild constipation     Objective:   Physical Exam BP 118/70  Pulse 65  Temp(Src) 97.6 F (36.4 C) (Oral)  Ht 5\' 6"  (1.676 m)  Wt 222 lb 8 oz (100.925 kg)  BMI 35.93 kg/m2  SpO2 96%  General:  Well-developed,well-nourished,in no acute distress; alert,appropriate and cooperative throughout examination Head:  normocephalic and atraumatic.   Eyes:  vision grossly intact, pupils equal, pupils round, and pupils reactive to light.   Ears:  R ear normal and L ear normal.   Nose:  no external deformity.   Mouth:  good dentition.    Heart:  Normal rate and regular rhythm. S1 and S2 normal without gallop, murmur, click, rub or other extra sounds. Abdomen:  Bowel sounds positive,abdomen soft and non-tender without masses, organomegaly or hernias noted. Msk:  No deformity or scoliosis noted of thoracic or lumbar spine.   Extremities:  No clubbing, cyanosis, edema, or deformity noted with normal full range of motion of all joints.   Neurologic:  alert & oriented X3 and gait normal.   Skin:  Intact without suspicious lesions or rashes Cervical Nodes:  No lymphadenopathy noted Axillary Nodes:  No palpable lymphadenopathy Psych:  Cognition and judgment appear intact. Alert and cooperative with normal attention span and concentration. No apparent delusions, illusions, hallucinations     Assessment & Plan:

## 2013-12-21 NOTE — Assessment & Plan Note (Signed)
Not ready to quit.  

## 2013-12-21 NOTE — Patient Instructions (Signed)
Great to see you. I will call you with your B12 results. Take Vit D as prescribed.  We will call you with a nutritionist referral.

## 2013-12-21 NOTE — Assessment & Plan Note (Signed)
Likely due to deconditioning. Vit D low normal- will replete. Check b12 as well today.

## 2013-12-23 ENCOUNTER — Ambulatory Visit (INDEPENDENT_AMBULATORY_CARE_PROVIDER_SITE_OTHER): Payer: 59 | Admitting: Family Medicine

## 2013-12-23 DIAGNOSIS — D509 Iron deficiency anemia, unspecified: Secondary | ICD-10-CM

## 2013-12-23 MED ORDER — CYANOCOBALAMIN 1000 MCG/ML IJ SOLN
1000.0000 ug | Freq: Once | INTRAMUSCULAR | Status: AC
  Administered 2013-12-23: 1000 ug via INTRAMUSCULAR

## 2013-12-30 ENCOUNTER — Ambulatory Visit (INDEPENDENT_AMBULATORY_CARE_PROVIDER_SITE_OTHER): Payer: 59 | Admitting: Internal Medicine

## 2013-12-30 ENCOUNTER — Other Ambulatory Visit (INDEPENDENT_AMBULATORY_CARE_PROVIDER_SITE_OTHER): Payer: 59

## 2013-12-30 ENCOUNTER — Encounter: Payer: Self-pay | Admitting: Internal Medicine

## 2013-12-30 VITALS — BP 110/80 | HR 80 | Ht 66.0 in | Wt 225.0 lb

## 2013-12-30 DIAGNOSIS — R1013 Epigastric pain: Secondary | ICD-10-CM

## 2013-12-30 DIAGNOSIS — R1012 Left upper quadrant pain: Secondary | ICD-10-CM

## 2013-12-30 LAB — IBC PANEL
IRON: 66 ug/dL (ref 42–145)
Saturation Ratios: 18.3 % — ABNORMAL LOW (ref 20.0–50.0)
TRANSFERRIN: 257.9 mg/dL (ref 212.0–360.0)

## 2013-12-30 LAB — VITAMIN B12: Vitamin B-12: 331 pg/mL (ref 211–911)

## 2013-12-30 LAB — PROTIME-INR
INR: 1.1 ratio — AB (ref 0.8–1.0)
Prothrombin Time: 12 s (ref 9.6–13.1)

## 2013-12-30 LAB — FOLATE: FOLATE: 13.8 ng/mL (ref >5.9)

## 2013-12-30 LAB — FERRITIN: Ferritin: 35.9 ng/mL (ref 10.0–291.0)

## 2013-12-30 NOTE — Progress Notes (Signed)
[name] [birth year] [id number]  Note: This dictation was prepared with Dragon digital system. Any transcriptional errors that result from this procedure are unintentional.   History of Present Illness: This is a 48 year old white female with intermittent left upper quadrant and epigastric discomfort. Last appointment in May 2013 for epigastric pain. She responded to Prilosec 20 mg a day with complete resolution of her symptoms. She stopped taking Prilosec. She takes Prilosec about once a month. There is a positive family history of gallbladder disease. Her ultrasound was normal in 2003. Her mother had colon polyps. She comes today only for a checkup. She denies any significant symptoms other than occasional constipation for which she takes stool softener. She was just found to be B12 deficient, 186 ug. Last hemoglobin was 14.6 hematocrit 42.9.    Past Medical History  Diagnosis Date  . Anemia   . SVT (supraventricular tachycardia)   . Smoker   . Obesity   . Stress fracture   . Vitamin D deficiency   . Arthritis   . HTN (hypertension)   . SVT (supraventricular tachycardia)   . Arthritis   . Abnormal uterine bleeding   . Obesity     Past Surgical History  Procedure Laterality Date  . Cesarean section      x 2  . Salpingectomy      right; adhesions  . Ovarian cyst surgery      left  . Tubal ligation    . Knee surgery      right x 2  . Appendectomy    . Hydrothermal ablation      uteral and cyst removal  . Hysteroscopy    . Left oophorectomy    . Dermoid cyst  excision      Allergies  Allergen Reactions  . Codeine   . Latex     Family history and social history have been reviewed.  Review of Systems: Denies dysphagia nausea diarrhea  The remainder of the 10 point ROS is negative except as outlined in the H&P  Physical Exam: General Appearance Well developed, in no distress, overweight Eyes  Non icteric  HEENT  Non traumatic, normocephalic  Mouth No  lesion, tongue papillated, no cheilosis Neck Supple without adenopathy, thyroid not enlarged, no carotid bruits, no JVD Lungs Clear to auscultation bilaterally COR Normal S1, normal S2, regular rhythm, no murmur, quiet precordium Abdomen protuberant abdomen with normoactive bowel sounds. Liver edge at costal margin. No tenderness in right upper quadrant. Minimal discomfort and left upper quadrant. Left lower quadrant unremarkable. Rectal medium sized external hemorrhoids. Normal rectal sphincter tone. Formed Hemoccult-negative stool in the ampulla Extremities  No pedal edema Skin No lesions Neurological Alert and oriented x 3 Psychological Normal mood and affect  Assessment and Plan:   48 year old white female with the intermittent dyspepsia. Usually responds to Prilosec 20 mg on a when necessary basis. Her symptoms are not significant enough to proceed with endoscopic evaluation. She had upper abdominal ultrasound in 2003, this may need to be repeated.. Denies food intolerance or right upper quadrant pain Mild constipation. She is Hemoccult-negative. I have asked her to purchase Benefiber and to take heaping teaspoon daily. She will  be due for screening colonoscopy at age 89  in August 2018     Hart Carwin 12/30/2013

## 2013-12-30 NOTE — Patient Instructions (Addendum)
Your physician has requested that you go to the basement for lab work before leaving today.  Take over the counter Benefiber one heaping teaspoon daily for constipation.   You will be due for a recall colonoscopy in 03/2017. We will send you a reminder in the mail when it gets closer to that time.   cc: Dr. Dayton Martes

## 2014-01-03 ENCOUNTER — Telehealth: Payer: Self-pay | Admitting: Internal Medicine

## 2014-01-03 NOTE — Telephone Encounter (Signed)
Please call pt with normal B12 level but slightly low Iron level. Take OTC Iron, one a day x 1 month. Continue B12 supplements.

## 2014-01-03 NOTE — Telephone Encounter (Signed)
Spoke with patient and she is asking for lab results. Please, advise.

## 2014-01-04 NOTE — Telephone Encounter (Signed)
Left a message for patient to call me. 

## 2014-01-04 NOTE — Telephone Encounter (Signed)
Spoke with patient and gave her results and recommendations. 

## 2014-01-19 ENCOUNTER — Ambulatory Visit: Payer: 59 | Admitting: Family Medicine

## 2014-01-19 ENCOUNTER — Ambulatory Visit (INDEPENDENT_AMBULATORY_CARE_PROVIDER_SITE_OTHER): Payer: 59 | Admitting: Family Medicine

## 2014-01-19 ENCOUNTER — Encounter: Payer: Self-pay | Admitting: Family Medicine

## 2014-01-19 VITALS — BP 127/90 | HR 63 | Ht 67.0 in | Wt 222.0 lb

## 2014-01-19 DIAGNOSIS — Z01419 Encounter for gynecological examination (general) (routine) without abnormal findings: Secondary | ICD-10-CM

## 2014-01-19 NOTE — Patient Instructions (Signed)

## 2014-01-19 NOTE — Progress Notes (Signed)
  Subjective:     [name] is a 48 y.o. female and is here for a comprehensive physical exam. The patient reports problems - vaginal dryness. Minimal uterine cramping.  Likely peri-menopausal..  History   Social History  . Marital Status: Married    Spouse Name: N/A    Number of Children: 2  . Years of Education: N/A   Occupational History  . 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 Smoking status: Current Every Day Smoker -- 1.00 packs/day for 25 years    Types: Cigarettes  . Smokeless tobacco: Never Used  . Alcohol Use: No  . Drug Use: No  . Sexual Activity: Yes    Partners: Male    Birth Control/ Protection: Surgical   Other Topics Concern  . Not on file   Social History Narrative  . No narrative on file   Health Maintenance  Topic Date Due  . Influenza Vaccine  03/11/2014  . Pap Smear  01/22/2016  . Tetanus/tdap  09/10/2021    The following portions of the patient's history were reviewed and updated as appropriate: allergies, current medications, past family history, past medical history, past social history, past surgical history and problem list.  Review of Systems A comprehensive review of systems was negative.   Objective:    BP 127/90  Pulse 63  Ht 5\' 7"  (1.702 m)  Wt 222 lb (100.699 kg)  BMI 34.76 kg/m2 General appearance: alert, cooperative and appears stated age Head: Normocephalic, without obvious abnormality, atraumatic Neck: no adenopathy, supple, symmetrical, trachea midline and thyroid not enlarged, symmetric, no tenderness/mass/nodules Lungs: clear to auscultation bilaterally Breasts: normal appearance, no masses or tenderness Heart: regular rate and rhythm, S1, S2 normal, no murmur, click, rub or gallop Abdomen: soft, non-tender; bowel sounds normal; no masses,  no organomegaly Pelvic: cervix normal in appearance, external genitalia normal, no adnexal masses or tenderness, no cervical motion tenderness, uterus  normal size, shape, and consistency and vagina normal without discharge Extremities: extremities normal, atraumatic, no cyanosis or edema Pulses: 2+ and symmetric Skin: Skin color, texture, turgor normal. No rashes or lesions Lymph nodes: Cervical, supraclavicular, and axillary nodes normal. Neurologic: Grossly normal    Assessment:    Healthy female exam. Perimenopause      Plan:    Normal pap with Neg. HPV last year.   Normal mammogram in 3/15. See After Visit Summary for Counseling Recommendations

## 2014-01-20 ENCOUNTER — Ambulatory Visit (INDEPENDENT_AMBULATORY_CARE_PROVIDER_SITE_OTHER): Payer: 59

## 2014-01-20 DIAGNOSIS — E538 Deficiency of other specified B group vitamins: Secondary | ICD-10-CM

## 2014-01-20 MED ORDER — CYANOCOBALAMIN 1000 MCG/ML IJ SOLN
1000.0000 ug | Freq: Once | INTRAMUSCULAR | Status: AC
  Administered 2014-01-20: 1000 ug via INTRAMUSCULAR

## 2014-02-24 ENCOUNTER — Ambulatory Visit (INDEPENDENT_AMBULATORY_CARE_PROVIDER_SITE_OTHER): Payer: 59

## 2014-02-24 DIAGNOSIS — E538 Deficiency of other specified B group vitamins: Secondary | ICD-10-CM

## 2014-02-24 MED ORDER — CYANOCOBALAMIN 1000 MCG/ML IJ SOLN
1000.0000 ug | Freq: Once | INTRAMUSCULAR | Status: AC
  Administered 2014-02-24: 1000 ug via INTRAMUSCULAR

## 2014-02-28 ENCOUNTER — Ambulatory Visit: Payer: 59 | Admitting: Dietician

## 2014-03-31 ENCOUNTER — Ambulatory Visit (INDEPENDENT_AMBULATORY_CARE_PROVIDER_SITE_OTHER): Payer: 59

## 2014-03-31 DIAGNOSIS — E538 Deficiency of other specified B group vitamins: Secondary | ICD-10-CM

## 2014-03-31 MED ORDER — CYANOCOBALAMIN 1000 MCG/ML IJ SOLN
1000.0000 ug | Freq: Once | INTRAMUSCULAR | Status: AC
  Administered 2014-03-31: 1000 ug via INTRAMUSCULAR

## 2014-04-06 ENCOUNTER — Other Ambulatory Visit: Payer: Self-pay | Admitting: Family Medicine

## 2014-04-07 ENCOUNTER — Other Ambulatory Visit: Payer: Self-pay | Admitting: Family Medicine

## 2014-04-07 MED ORDER — HYDROCHLOROTHIAZIDE 12.5 MG PO CAPS: ORAL_CAPSULE | ORAL | Status: DC

## 2014-04-25 ENCOUNTER — Ambulatory Visit: Payer: 59 | Admitting: Dietician

## 2014-04-28 ENCOUNTER — Ambulatory Visit (INDEPENDENT_AMBULATORY_CARE_PROVIDER_SITE_OTHER): Payer: 59

## 2014-04-28 DIAGNOSIS — E538 Deficiency of other specified B group vitamins: Secondary | ICD-10-CM

## 2014-04-28 MED ORDER — CYANOCOBALAMIN 1000 MCG/ML IJ SOLN
1000.0000 ug | Freq: Once | INTRAMUSCULAR | Status: AC
  Administered 2014-04-28: 1000 ug via INTRAMUSCULAR

## 2014-05-26 ENCOUNTER — Other Ambulatory Visit: Payer: Self-pay

## 2014-06-02 ENCOUNTER — Ambulatory Visit (INDEPENDENT_AMBULATORY_CARE_PROVIDER_SITE_OTHER): Payer: 59

## 2014-06-02 DIAGNOSIS — E538 Deficiency of other specified B group vitamins: Secondary | ICD-10-CM

## 2014-06-02 MED ORDER — CYANOCOBALAMIN 1000 MCG/ML IJ SOLN
1000.0000 ug | Freq: Once | INTRAMUSCULAR | Status: AC
  Administered 2014-06-02: 1000 ug via INTRAMUSCULAR

## 2014-06-12 ENCOUNTER — Encounter: Payer: Self-pay | Admitting: Family Medicine

## 2014-06-26 ENCOUNTER — Telehealth: Payer: Self-pay | Admitting: Family Medicine

## 2014-06-26 NOTE — Telephone Encounter (Signed)
Patient Information:  Caller Name: Britta MccreedyBarbara  Phone: [phone]  Patient: Sonya MuttonRiley, Sonya F  Gender: Female  DOB: [birth year]  Age: 48 Years  PCP: Ruthe MannanAron, [facility](Family Practice)  Pregnant: No  Office Follow Up:  Does the office need to follow up with this patient?: No  Instructions For The Office: N/A   Symptoms  Reason For Call & Symptoms: Cough and nasal congestion. Emergent symptoms ruled out.  Home Care per Cough guideline.  Caller statres she tried to get appointment last week and cannot miss work again. She requests antibiotics due to slight brown tinge to sputum. Advised of standing orders that she must be seen to get antibiotics.  Reviewed Health History In EMR: Yes  Reviewed Medications In EMR: Yes  Reviewed Allergies In EMR: Yes  Reviewed Surgeries / Procedures: Yes  Date of Onset of Symptoms: 06/21/2014  Treatments Tried: Nyquil  Treatments Tried Worked: No OB / GYN:  LMP: Unknown  Guideline(s) Used:  Colds  Cough  Disposition Per Guideline:   Home Care  Reason For Disposition Reached:   Cough with no complications  Advice Given:  Reassurance  Coughing is the way that our lungs remove irritants and mucus. It helps protect our lungs from getting pneumonia.  You can get a dry hacking cough after a chest cold. Sometimes this type of cough can last 1-3 weeks, and be worse at night.  You can also get a cough after being exposed to irritating substances like smoke, strong perfumes, and dust.  Cough Medicines:  OTC Cough Syrups: The most common cough suppressant in OTC cough medications is dextromethorphan. Often the letters "DM" appear in the name.  OTC Cough Drops: Cough drops can help a lot, especially for mild coughs. They reduce coughing by soothing your irritated throat and removing that tickle sensation in the back of the throat. Cough drops also have the advantage of portability - you can carry them with you.  Home Remedy - Hard Candy: Hard candy works just as  well as medicine-flavored OTC cough drops. Diabetics should use sugar-free candy.  Coughing Spasms:  Drink warm fluids. Inhale warm mist (Reason: both relax the airway and loosen up the phlegm).  Prevent Dehydration:  Drink adequate liquids.  This will help soothe an irritated or dry throat and loosen up the phlegm.  Fever Medicines:  For fevers above 101 F (38.3 C) take either acetaminophen or ibuprofen.  They are over-the-counter (OTC) drugs that help treat both fever and pain. You can buy them at the drugstore.l  The goal of fever therapy is to bring the fever down to a comfortable level. Remember that fever medicine usually lowers fever 2 degrees F (1 - 1 1/2 degrees C).  Expected Course:   Viral bronchitis (chest cold) causes a cough that lasts 1 to 3 weeks. Sometimes you may cough up lots of phlegm (sputum, mucus). The mucus can normally be white, gray, yellow, or green.  Call Back If:  Difficulty breathing  Cough lasts more than 3 weeks  Fever lasts > 3 days  You become worse.  For a Stuffy Nose - Use Nasal Washes:  Introduction: Saline (salt water) nasal irrigation (nasal wash) is an effective and simple home remedy for treating stuffy nose and sinus congestion. The nose can be irrigated by pouring, spraying, or squirting salt water into the nose and then letting it run back out.  How it Helps: The salt water rinses out excess mucus, washes out any irritants (dust, allergens) that  might be present, and moistens the nasal cavity.  Patient Will Follow Care Advice:  YES

## 2014-06-29 ENCOUNTER — Encounter: Payer: Self-pay | Admitting: Family Medicine

## 2014-06-29 ENCOUNTER — Ambulatory Visit (INDEPENDENT_AMBULATORY_CARE_PROVIDER_SITE_OTHER): Payer: 59 | Admitting: Family Medicine

## 2014-06-29 VITALS — BP 128/80 | HR 83 | Temp 97.5°F | Wt 223.0 lb

## 2014-06-29 DIAGNOSIS — J069 Acute upper respiratory infection, unspecified: Secondary | ICD-10-CM

## 2014-06-29 DIAGNOSIS — B9789 Other viral agents as the cause of diseases classified elsewhere: Principal | ICD-10-CM

## 2014-06-29 MED ORDER — AZITHROMYCIN 250 MG PO TABS: ORAL_TABLET | ORAL | Status: DC

## 2014-06-29 MED ORDER — BENZONATATE 200 MG PO CAPS: 200.0000 mg | ORAL_CAPSULE | Freq: Two times a day (BID) | ORAL | Status: DC | PRN

## 2014-06-29 NOTE — Assessment & Plan Note (Signed)
Most likely viral infection, but given smoking history will have her start antibiotics if not improving in 2 days.  Treat with mucolytic and nasal saline.

## 2014-06-29 NOTE — Progress Notes (Signed)
   Subjective:    Patient ID: [name], female    DOB: 11/24/1965, 48 y.o.   MRN: [id number]  Cough This is a new problem. The current episode started in the past 7 days. The problem has been gradually worsening. The cough is productive of sputum. Associated symptoms include headaches, nasal congestion, postnasal drip and rhinorrhea. Pertinent negatives include no chills, ear congestion, ear pain, fever, myalgias, shortness of breath or wheezing. Associated symptoms comments: Sinus pain. The symptoms are aggravated by lying down. Risk factors for lung disease include smoking/tobacco exposure. Treatments tried: nyquil. The treatment provided mild relief. There is no history of asthma, bronchiectasis, bronchitis, COPD, emphysema, environmental allergies or pneumonia.      Review of Systems  Constitutional: Negative for fever and chills.  HENT: Positive for postnasal drip and rhinorrhea. Negative for ear pain.   Respiratory: Positive for cough. Negative for shortness of breath and wheezing.   Musculoskeletal: Negative for myalgias.  Allergic/Immunologic: Negative for environmental allergies.  Neurological: Positive for headaches.       Objective:   Physical Exam  Constitutional: Vital signs are normal. She appears well-developed and well-nourished. She is cooperative.  Non-toxic appearance. She does not appear ill. No distress.  HENT:  Head: Normocephalic.  Right Ear: Hearing, tympanic membrane, external ear and ear canal normal. Tympanic membrane is not erythematous, not retracted and not bulging.  Left Ear: Hearing, tympanic membrane, external ear and ear canal normal. Tympanic membrane is not erythematous, not retracted and not bulging.  Nose: Mucosal edema and rhinorrhea present. Right sinus exhibits no maxillary sinus tenderness and no frontal sinus tenderness. Left sinus exhibits no maxillary sinus tenderness and no frontal sinus tenderness.  Mouth/Throat: Uvula is midline,  oropharynx is clear and moist and mucous membranes are normal.  Eyes: Conjunctivae, EOM and lids are normal. Pupils are equal, round, and reactive to light. Lids are everted and swept, no foreign bodies found.  Neck: Trachea normal and normal range of motion. Neck supple. Carotid bruit is not present. No thyroid mass and no thyromegaly present.  Cardiovascular: Normal rate, regular rhythm, S1 normal, S2 normal, normal heart sounds, intact distal pulses and normal pulses.  Exam reveals no gallop and no friction rub.   No murmur heard. Pulmonary/Chest: Effort normal and breath sounds normal. No tachypnea. No respiratory distress. She has no decreased breath sounds. She has no wheezes. She has no rhonchi. She has no rales.  Neurological: She is alert.  Skin: Skin is warm, dry and intact. No rash noted.  Psychiatric: Her speech is normal and behavior is normal. Judgment normal. Her mood appears not anxious. Cognition and memory are normal. She does not exhibit a depressed mood.          Assessment & Plan:

## 2014-06-29 NOTE — Progress Notes (Signed)
Pre visit review using our clinic review tool, if applicable. No additional management support is needed unless otherwise documented below in the visit note. 

## 2014-06-29 NOTE — Patient Instructions (Signed)
Rest, fluids. Mucinex DM twice daily. Nasal saline spray 2-3 times a day. Tessalon perles for cough as needed. If not improving in next 48 hours consider starting antibiotics. Go to ER if shortness of breath severe.

## 2014-07-14 ENCOUNTER — Ambulatory Visit (INDEPENDENT_AMBULATORY_CARE_PROVIDER_SITE_OTHER): Payer: 59 | Admitting: *Deleted

## 2014-07-14 DIAGNOSIS — D519 Vitamin B12 deficiency anemia, unspecified: Secondary | ICD-10-CM

## 2014-07-14 MED ORDER — CYANOCOBALAMIN 1000 MCG/ML IJ SOLN
1000.0000 ug | Freq: Once | INTRAMUSCULAR | Status: AC
  Administered 2014-07-14: 1000 ug via INTRAMUSCULAR

## 2014-08-09 ENCOUNTER — Other Ambulatory Visit: Payer: Self-pay | Admitting: *Deleted

## 2014-08-09 MED ORDER — HYDROCHLOROTHIAZIDE 12.5 MG PO CAPS: ORAL_CAPSULE | ORAL | Status: DC

## 2014-08-18 ENCOUNTER — Ambulatory Visit (INDEPENDENT_AMBULATORY_CARE_PROVIDER_SITE_OTHER): Payer: 59 | Admitting: Family Medicine

## 2014-08-18 ENCOUNTER — Ambulatory Visit: Payer: 59

## 2014-08-18 DIAGNOSIS — E538 Deficiency of other specified B group vitamins: Secondary | ICD-10-CM

## 2014-08-18 MED ORDER — CYANOCOBALAMIN 1000 MCG/ML IJ SOLN
1000.0000 ug | Freq: Once | INTRAMUSCULAR | Status: AC
  Administered 2014-08-18: 1000 ug via INTRAMUSCULAR

## 2014-09-15 ENCOUNTER — Ambulatory Visit: Payer: 59

## 2014-09-18 ENCOUNTER — Telehealth: Payer: Self-pay | Admitting: Family Medicine

## 2014-09-18 NOTE — Telephone Encounter (Signed)
I dont do a lot of I and Ds so she could certainly see a dermatologist.

## 2014-09-18 NOTE — Telephone Encounter (Signed)
Lm on pts vm and advised per Dr Dayton MartesAron. Requested pt contact office should she have additional questions

## 2014-09-18 NOTE — Telephone Encounter (Signed)
Pt called in with a boil, cyst, ingrown hair type of issue.  Is sore to the touch warm and red and painful..Pt wants to know if Dr Dayton MartesAron can take care of this or if she needs to see a dermatologist? Best number to call back [phone].

## 2014-09-29 ENCOUNTER — Ambulatory Visit (INDEPENDENT_AMBULATORY_CARE_PROVIDER_SITE_OTHER): Payer: 59 | Admitting: *Deleted

## 2014-09-29 DIAGNOSIS — D519 Vitamin B12 deficiency anemia, unspecified: Secondary | ICD-10-CM

## 2014-09-29 MED ORDER — CYANOCOBALAMIN 1000 MCG/ML IJ SOLN
1000.0000 ug | Freq: Once | INTRAMUSCULAR | Status: AC
  Administered 2014-09-29: 1000 ug via INTRAMUSCULAR

## 2014-09-30 ENCOUNTER — Telehealth: Payer: Self-pay | Admitting: Family Medicine

## 2014-10-12 NOTE — Telephone Encounter (Signed)
Pt eft v/m requesting refill hctz; left v/m for pt to cb; refill done in 09/2014.

## 2014-10-25 NOTE — Telephone Encounter (Signed)
Spoke with pt and she was told at pharmacy she had no refills on HCTZ; [facility] and was advised 10/02/14 #90 rx was received but ins only allows 30 day refills; pt has # 60 still available. Pt voiced understanding.

## 2014-10-29 ENCOUNTER — Other Ambulatory Visit: Payer: Self-pay | Admitting: Family Medicine

## 2014-11-10 ENCOUNTER — Ambulatory Visit: Payer: 59

## 2014-11-17 ENCOUNTER — Ambulatory Visit (INDEPENDENT_AMBULATORY_CARE_PROVIDER_SITE_OTHER): Payer: 59 | Admitting: *Deleted

## 2014-11-17 DIAGNOSIS — D519 Vitamin B12 deficiency anemia, unspecified: Secondary | ICD-10-CM

## 2014-11-17 MED ORDER — CYANOCOBALAMIN 1000 MCG/ML IJ SOLN
1000.0000 ug | Freq: Once | INTRAMUSCULAR | Status: AC
Start: 2014-11-17 — End: 2014-11-17
  Administered 2014-11-17: 1000 ug via INTRAMUSCULAR

## 2014-12-03 ENCOUNTER — Other Ambulatory Visit: Payer: Self-pay | Admitting: Family Medicine

## 2014-12-13 ENCOUNTER — Encounter: Payer: Self-pay | Admitting: Family Medicine

## 2014-12-18 ENCOUNTER — Other Ambulatory Visit: Payer: Self-pay | Admitting: Family Medicine

## 2014-12-18 ENCOUNTER — Other Ambulatory Visit (INDEPENDENT_AMBULATORY_CARE_PROVIDER_SITE_OTHER): Payer: 59

## 2014-12-18 DIAGNOSIS — D509 Iron deficiency anemia, unspecified: Secondary | ICD-10-CM

## 2014-12-18 DIAGNOSIS — Z0000 Encounter for general adult medical examination without abnormal findings: Secondary | ICD-10-CM

## 2014-12-18 DIAGNOSIS — E538 Deficiency of other specified B group vitamins: Secondary | ICD-10-CM | POA: Insufficient documentation

## 2014-12-18 LAB — CBC WITH DIFFERENTIAL/PLATELET
Basophils Absolute: 0.1 10*3/uL (ref 0.0–0.1)
Basophils Relative: 0.6 % (ref 0.0–3.0)
Eosinophils Absolute: 0.3 10*3/uL (ref 0.0–0.7)
Eosinophils Relative: 4 % (ref 0.0–5.0)
HCT: 43.5 % (ref 36.0–46.0)
Hemoglobin: 14.9 g/dL (ref 12.0–15.0)
LYMPHS ABS: 3.8 10*3/uL (ref 0.7–4.0)
Lymphocytes Relative: 43 % (ref 12.0–46.0)
MCHC: 34.2 g/dL (ref 30.0–36.0)
MCV: 88.7 fl (ref 78.0–100.0)
Monocytes Absolute: 0.6 10*3/uL (ref 0.1–1.0)
Monocytes Relative: 6.9 % (ref 3.0–12.0)
NEUTROS ABS: 4 10*3/uL (ref 1.4–7.7)
NEUTROS PCT: 45.5 % (ref 43.0–77.0)
PLATELETS: 338 10*3/uL (ref 150.0–400.0)
RBC: 4.9 Mil/uL (ref 3.87–5.11)
RDW: 13.1 % (ref 11.5–15.5)
WBC: 8.8 10*3/uL (ref 4.0–10.5)

## 2014-12-18 LAB — COMPREHENSIVE METABOLIC PANEL
ALT: 9 U/L (ref 0–35)
AST: 16 U/L (ref 0–37)
Albumin: 4.2 g/dL (ref 3.5–5.2)
Alkaline Phosphatase: 66 U/L (ref 39–117)
BUN: 12 mg/dL (ref 6–23)
CO2: 31 meq/L (ref 19–32)
CREATININE: 0.73 mg/dL (ref 0.40–1.20)
Calcium: 9.8 mg/dL (ref 8.4–10.5)
Chloride: 103 mEq/L (ref 96–112)
GFR: 90.55 mL/min (ref >60.00)
GLUCOSE: 97 mg/dL (ref 70–99)
Potassium: 4.2 mEq/L (ref 3.5–5.1)
Sodium: 138 mEq/L (ref 135–145)
Total Bilirubin: 0.6 mg/dL (ref 0.2–1.2)
Total Protein: 7.1 g/dL (ref 6.0–8.3)

## 2014-12-18 LAB — LIPID PANEL
CHOL/HDL RATIO: 5
Cholesterol: 185 mg/dL (ref 0–200)
HDL: 40.8 mg/dL (ref >39.00)
LDL CALC: 115 mg/dL — AB (ref 0–99)
NONHDL: 144.2
Triglycerides: 145 mg/dL (ref 0.0–149.0)
VLDL: 29 mg/dL (ref 0.0–40.0)

## 2014-12-18 LAB — VITAMIN B12: Vitamin B-12: 349 pg/mL (ref 211–911)

## 2014-12-18 LAB — TSH: TSH: 0.89 u[IU]/mL (ref 0.35–4.50)

## 2014-12-25 ENCOUNTER — Encounter: Payer: Self-pay | Admitting: Family Medicine

## 2014-12-25 ENCOUNTER — Ambulatory Visit (INDEPENDENT_AMBULATORY_CARE_PROVIDER_SITE_OTHER): Payer: 59 | Admitting: Family Medicine

## 2014-12-25 VITALS — BP 124/68 | HR 67 | Temp 97.7°F | Ht 66.25 in | Wt 225.8 lb

## 2014-12-25 DIAGNOSIS — Z0000 Encounter for general adult medical examination without abnormal findings: Secondary | ICD-10-CM

## 2014-12-25 DIAGNOSIS — E538 Deficiency of other specified B group vitamins: Secondary | ICD-10-CM

## 2014-12-25 DIAGNOSIS — E559 Vitamin D deficiency, unspecified: Secondary | ICD-10-CM

## 2014-12-25 DIAGNOSIS — E669 Obesity, unspecified: Secondary | ICD-10-CM | POA: Diagnosis not present

## 2014-12-25 LAB — VITAMIN D 25 HYDROXY (VIT D DEFICIENCY, FRACTURES): VITD: 25.87 ng/mL — ABNORMAL LOW (ref 30.00–100.00)

## 2014-12-25 MED ORDER — HYDROCHLOROTHIAZIDE 12.5 MG PO CAPS: 12.5000 mg | ORAL_CAPSULE | Freq: Every day | ORAL | Status: DC

## 2014-12-25 MED ORDER — CYANOCOBALAMIN 1000 MCG/ML IJ SOLN
1000.0000 ug | Freq: Once | INTRAMUSCULAR | Status: AC
  Administered 2014-12-25: 1000 ug via INTRAMUSCULAR

## 2014-12-25 NOTE — Assessment & Plan Note (Signed)
IM b12 today. 

## 2014-12-25 NOTE — Progress Notes (Signed)
Subjective:    Patient ID: [name], female    DOB: 1965/08/19, 49 y.o.   MRN: [id number]  HPI  49 yo pleasant female here for CPX without complaints.  OBGYN- Dr. Shawnie PonsPratt. Last pap 01/21/13 Mammogram 12/12/14 Remote h/o ablation  HTN- has been well controlled on HCTZ 12.5 mg daily. Denies any LE edema, HA, blurred vision, CP or SOB. Lab Results  Component Value Date   CREATININE 0.73 12/18/2014   Obesity-  Wants to do something about her weight.  Admits to not eating right and not exercising. Has been seeing ortho for plantar fascitis. Wt Readings from Last 3 Encounters:  12/25/14 225 lb 12 oz (102.4 kg)  06/29/14 223 lb (101.152 kg)  01/19/14 222 lb (100.699 kg)    Lab Results  Component Value Date   CHOL 185 12/18/2014   HDL 40.80 12/18/2014   LDLCALC 115* 12/18/2014   TRIG 145.0 12/18/2014   CHOLHDL 5 12/18/2014   Lab Results  Component Value Date   TSH 0.89 12/18/2014   Lab Results  Component Value Date   ALT 9 12/18/2014   AST 16 12/18/2014   ALKPHOS 66 12/18/2014   BILITOT 0.6 12/18/2014   Lab Results  Component Value Date   NA 138 12/18/2014   K 4.2 12/18/2014   CL 103 12/18/2014   CO2 31 12/18/2014    Vit B12 deficiency-  Has been receiving monthly IM b12 injections here.  Does feel less tired and her hair has stopped falling out. Lab Results  Component Value Date   VITAMINB12 349 12/18/2014   Vit D deficiency-  Stopped taking Vit D.  Would like it rechecked today.  Patient Active Problem List   Diagnosis Date Noted  . B12 deficiency 12/18/2014  . Other malaise and fatigue 12/21/2013  . Routine general medical examination at a health care facility 12/21/2013  . Other and unspecified ovarian cyst 10/28/2013  . Menopause 10/28/2013  . Adjustment disorder with anxious mood 12/23/2010  . HYPERTENSION 03/04/2010  . DEPRESSION, SITUATIONAL 05/15/2009  . Vitamin D deficiency 09/25/2008  . Obesity 09/25/2008  . Iron deficiency anemia  09/25/2008  . TOBACCO ABUSE 08/25/2007   Past Medical History  Diagnosis Date  . Anemia   . SVT (supraventricular tachycardia)   . Smoker   . Obesity   . Stress fracture   . Vitamin D deficiency   . Arthritis   . HTN (hypertension)   . SVT (supraventricular tachycardia)   . Arthritis   . Abnormal uterine bleeding   . Obesity    Past Surgical History  Procedure Laterality Date  . Cesarean section      x 2  . Salpingectomy      right; adhesions  . Ovarian cyst surgery      left  . Tubal ligation    . Knee surgery      right x 2  . Appendectomy    . Hydrothermal ablation      uteral and cyst removal  . Hysteroscopy    . Left oophorectomy    . Dermoid cyst  excision     History  Substance Use Topics  . Smoking status: Current Every Day Smoker -- 1.00 packs/day for 25 years    Types: Cigarettes  . Smokeless tobacco: Never Used  . Alcohol Use: No   Family History  Problem Relation Age of Onset  . Diabetes Brother   . Seizures Brother   . Colon cancer Neg Hx   .  Cancer Maternal Grandmother      gallbladder  . Stroke Paternal Grandfather   . Stroke Brother   . Colon polyps Mother   . Heart disease Father   . Diabetes Father    Allergies  Allergen Reactions  . Codeine   . Latex     Current outpatient prescriptions:  .  diphenhydrAMINE (BENADRYL) 25 MG tablet, Take 25 mg by mouth at bedtime as needed for itching., Disp: , Rfl:  .  hydrochlorothiazide (MICROZIDE) 12.5 MG capsule, TAKE 1 TABLET BY MOUTH EVERY MORNING, Disp: 90 capsule, Rfl: 0 .  vitamin B-12 (CYANOCOBALAMIN) 1000 MCG tablet, Take 1,000 mcg by mouth every 30 (thirty) days., Disp: , Rfl:  .  ergocalciferol (VITAMIN D2) 50000 UNITS capsule, Take 1 capsule (50,000 Units total) by mouth once a week. (Patient not taking: Reported on 12/25/2014), Disp: 6 capsule, Rfl: 12   The PMH, PSH, Social History, Family History, Medications, and allergies have been reviewed in [facility], and have been updated if  relevant.     Review of Systems  Constitutional: Negative.   HENT: Negative.   Respiratory: Negative.   Cardiovascular: Negative.   Gastrointestinal: Negative.   Endocrine: Negative.   Genitourinary: Negative.   Musculoskeletal: Positive for myalgias.  Skin: Negative.   Allergic/Immunologic: Negative.   Neurological: Negative.   Hematological: Negative.   Psychiatric/Behavioral: Negative.   All other systems reviewed and are negative.  See HPI     Objective:   Physical Exam BP 124/68 mmHg  Pulse 67  Temp(Src) 97.7 F (36.5 C) (Oral)  Ht 5' 6.25" (1.683 m)  Wt 225 lb 12 oz (102.4 kg)  BMI 36.15 kg/m2  SpO2 97%  General:  Obese,  Well-developed,well-nourished,in no acute distress; alert,appropriate and cooperative throughout examination Head:  normocephalic and atraumatic.   Eyes:  vision grossly intact, pupils equal, pupils round, and pupils reactive to light.   Ears:  R ear normal and L ear normal.   Nose:  no external deformity.   Mouth:  good dentition.    Heart:  Normal rate and regular rhythm. S1 and S2 normal without gallop, murmur, click, rub or other extra sounds. Abdomen:  Bowel sounds positive,abdomen soft and non-tender without masses, organomegaly or hernias noted. Msk:  No deformity or scoliosis noted of thoracic or lumbar spine.   Extremities:  No clubbing, cyanosis, edema, or deformity noted with normal full range of motion of all joints.   Neurologic:  alert & oriented X3 and gait normal.   Skin:  Intact without suspicious lesions or rashes Cervical Nodes:  No lymphadenopathy noted Axillary Nodes:  No palpable lymphadenopathy Psych:  Cognition and judgment appear intact. Alert and cooperative with normal attention span and concentration. No apparent delusions, illusions, hallucinations     Assessment & Plan:

## 2014-12-25 NOTE — Assessment & Plan Note (Signed)
Check Vit D level today. 

## 2014-12-25 NOTE — Progress Notes (Signed)
Pre visit review using our clinic review tool, if applicable. No additional management support is needed unless otherwise documented below in the visit note. 

## 2014-12-25 NOTE — Patient Instructions (Addendum)
Great to see you. I know you can do this!  Walk 30 minutes per daily.  Try My Fitness Pal App  I will call you with your lab results.

## 2014-12-25 NOTE — Assessment & Plan Note (Signed)
Reviewed preventive care protocols, scheduled due services, and updated immunizations Discussed nutrition, exercise, diet, and healthy lifestyle.  

## 2014-12-25 NOTE — Addendum Note (Signed)
Addended by: Desmond DikeKNIGHT, Raina Sole H on: 12/25/2014 09:28 AM   Modules accepted: Orders

## 2014-12-25 NOTE — Assessment & Plan Note (Signed)
Discussed weight loss plan. She would like to work with nutritionist   Also advised at least 30 minutes of walking per day, keeping track of calories on an app like MyFitness pal.

## 2014-12-26 ENCOUNTER — Telehealth: Payer: Self-pay

## 2014-12-26 NOTE — Telephone Encounter (Signed)
That is up to her.  If she can afford the HCTZ, we can continue this but we can also try an alternative if she would prefer.

## 2014-12-26 NOTE — Telephone Encounter (Signed)
Spoke to pt and advised per Dr Dayton MartesAron. Pt states she can afford medication and will continue to take as prescribed

## 2014-12-26 NOTE — Telephone Encounter (Signed)
Pt was seen 12/25/14; pt said her ins will no longer cover HCTZ and pt got call from [facility] that HCTZ was ready at cost to pt of $15.00 - $20.00. Pt said it does not matter to her but wants to know if Dr Dayton MartesAron wants pt to continue HCTZ or try different med. Pt request cb.

## 2014-12-29 ENCOUNTER — Other Ambulatory Visit: Payer: Self-pay | Admitting: Family Medicine

## 2015-02-15 ENCOUNTER — Other Ambulatory Visit: Payer: Self-pay | Admitting: Family Medicine

## 2015-02-15 DIAGNOSIS — E559 Vitamin D deficiency, unspecified: Secondary | ICD-10-CM

## 2015-02-23 ENCOUNTER — Ambulatory Visit (INDEPENDENT_AMBULATORY_CARE_PROVIDER_SITE_OTHER): Payer: 59 | Admitting: *Deleted

## 2015-02-23 ENCOUNTER — Other Ambulatory Visit (INDEPENDENT_AMBULATORY_CARE_PROVIDER_SITE_OTHER): Payer: 59

## 2015-02-23 DIAGNOSIS — E538 Deficiency of other specified B group vitamins: Secondary | ICD-10-CM | POA: Diagnosis not present

## 2015-02-23 DIAGNOSIS — E559 Vitamin D deficiency, unspecified: Secondary | ICD-10-CM | POA: Diagnosis not present

## 2015-02-23 LAB — VITAMIN D 25 HYDROXY (VIT D DEFICIENCY, FRACTURES): VITD: 29.61 ng/mL — ABNORMAL LOW (ref 30.00–100.00)

## 2015-02-23 MED ORDER — CYANOCOBALAMIN 1000 MCG/ML IJ SOLN
1000.0000 ug | Freq: Once | INTRAMUSCULAR | Status: AC
  Administered 2015-02-23: 1000 ug via INTRAMUSCULAR

## 2015-02-26 ENCOUNTER — Ambulatory Visit: Payer: 59 | Admitting: *Deleted

## 2015-03-01 ENCOUNTER — Telehealth: Payer: Self-pay | Admitting: Family Medicine

## 2015-03-01 MED ORDER — VITAMIN D (ERGOCALCIFEROL) 1.25 MG (50000 UNIT) PO CAPS: 50000.0000 [IU] | ORAL_CAPSULE | ORAL | Status: DC

## 2015-03-01 NOTE — Telephone Encounter (Signed)
Pt wants to know if she needs to make changes to her 50,000 units vit d medication since her levels are up. Do you want to change to a 25,000 unit capsule?  Best number to call is mobile number. Thanks.

## 2015-03-01 NOTE — Telephone Encounter (Signed)
Let's do another 7 weeks of Vit D 50,000 IU weekly, then recheck labs and decrease dose at that time.

## 2015-03-01 NOTE — Telephone Encounter (Signed)
Spoke to pt and advised per Dr Dayton Martes. Pts insurance will only cover 4tabs versus 6. Advised to take those and pt states she will cb to schedule follow up labs

## 2015-03-20 ENCOUNTER — Other Ambulatory Visit: Payer: Self-pay | Admitting: Family Medicine

## 2015-03-30 ENCOUNTER — Ambulatory Visit (INDEPENDENT_AMBULATORY_CARE_PROVIDER_SITE_OTHER): Payer: 59

## 2015-03-30 DIAGNOSIS — E538 Deficiency of other specified B group vitamins: Secondary | ICD-10-CM | POA: Diagnosis not present

## 2015-03-30 MED ORDER — CYANOCOBALAMIN 1000 MCG/ML IJ SOLN
1000.0000 ug | Freq: Once | INTRAMUSCULAR | Status: AC
  Administered 2015-03-30: 1000 ug via INTRAMUSCULAR

## 2015-04-01 ENCOUNTER — Encounter (HOSPITAL_COMMUNITY): Payer: Self-pay | Admitting: *Deleted

## 2015-04-01 ENCOUNTER — Emergency Department (HOSPITAL_COMMUNITY)
Admission: EM | Admit: 2015-04-01 | Discharge: 2015-04-01 | Disposition: A | Discharge status: Home / Self Care | Payer: 59 | Source: Home / Self Care | Attending: Emergency Medicine | Admitting: Emergency Medicine

## 2015-04-01 DIAGNOSIS — J302 Other seasonal allergic rhinitis: Secondary | ICD-10-CM

## 2015-04-01 DIAGNOSIS — H1013 Acute atopic conjunctivitis, bilateral: Secondary | ICD-10-CM

## 2015-04-01 NOTE — ED Notes (Signed)
Pt  Reports    Irritation  To  r  Eye       X  1  Month          Feels   Puffy  And  Woke  Up  With the  Eye  Matted  This  Am    -  Pt  Also  Reports       Pain  In her  r  Foot     For  About  1  Month  denys  A  specefic  Injury  But  Reports  Some   History  Of fascitis

## 2015-04-01 NOTE — ED Provider Notes (Signed)
CSN: [id number]     Arrival date & time 04/01/15  1708 History   First MD Initiated Contact with Patient 04/01/15 1756     Chief Complaint  Patient presents with  . Eye Problem   (Consider location/radiation/quality/duration/timing/severity/associated sxs/prior Treatment) HPI Comments: 49 year old female concerned about some redness to the right eye for 4 days. There has been mild puffiness around the eye and some clear watery type drainage. Denies changes in vision. She also has mild nasal congestion. Denies fever. Patient is a 49 y.o. female presenting with conjunctivitis. The history is provided by the patient.  Conjunctivitis This is a new problem. The current episode started more than 2 days ago. The problem occurs daily. The problem has been gradually worsening. Pertinent negatives include no chest pain, no headaches and no shortness of breath. The symptoms are aggravated by smoking. Nothing relieves the symptoms.    Past Medical History  Diagnosis Date  . Anemia   . SVT (supraventricular tachycardia)   . Smoker   . Obesity   . Stress fracture   . Vitamin D deficiency   . Arthritis   . HTN (hypertension)   . SVT (supraventricular tachycardia)   . Arthritis   . Abnormal uterine bleeding   . Obesity    Past Surgical History  Procedure Laterality Date  . Cesarean section      x 2  . Salpingectomy      right; adhesions  . Ovarian cyst surgery      left  . Tubal ligation    . Knee surgery      right x 2  . Appendectomy    . Hydrothermal ablation      uteral and cyst removal  . Hysteroscopy    . Left oophorectomy    . Dermoid cyst  excision     Family History  Problem Relation Age of Onset  . Diabetes Brother   . Seizures Brother   . Colon cancer Neg Hx   . Cancer Maternal Grandmother      gallbladder  . Stroke Paternal Grandfather   . Stroke Brother   . Colon polyps Mother   . Heart disease Father   . Diabetes Father    Social History  Substance Use  Topics  . Smoking status: Current Every Day Smoker -- 1.00 packs/day for 25 years    Types: Cigarettes  . Smokeless tobacco: Never Used  . Alcohol Use: No   OB History    Gravida Para Term Preterm AB TAB SAB Ectopic Multiple Living   Review of Systems  Constitutional: Positive for fever. Negative for activity change and fatigue.  HENT: Positive for congestion. Negative for ear pain, rhinorrhea and sore throat.   Eyes: Positive for discharge, redness and itching. Negative for photophobia, pain and visual disturbance.  Respiratory: Positive for cough. Negative for shortness of breath and wheezing.   Cardiovascular: Negative.  Negative for chest pain.  Neurological: Negative.  Negative for headaches.    Allergies  Codeine and Latex  Home Medications   Prior to Admission medications   Medication Sig Start Date End Date Taking? Authorizing Provider  diphenhydrAMINE (BENADRYL) 25 MG tablet Take 25 mg by mouth at bedtime as needed for itching.    Historical Provider, MD  hydrochlorothiazide (MICROZIDE) 12.5 MG capsule Take 1 capsule (12.5 mg total) by mouth daily. 12/25/14   Dianne Dun, MD  vitamin B-12 (CYANOCOBALAMIN) 1000  MCG tablet Take 1,000 mcg by mouth every 30 (thirty) days.    Historical Provider, MD  Vitamin D, Ergocalciferol, (DRISDOL) 50000 UNITS CAPS capsule TAKE ONE CAPSULE BY MOUTH ONCE A WEEK 03/20/15   Dianne Dun, MD   BP 120/85 mmHg  Pulse 80  Temp(Src) 98.3 F (36.8 C) (Oral)  Resp 16  SpO2 97% Physical Exam  Constitutional: She is oriented to person, place, and time. She appears well-developed and well-nourished. No distress.  HENT:  Mouth/Throat: No oropharyngeal exudate.  Bilateral TMs are normal Oropharynx moist with minimal, light erythema and scant clear PND.  Eyes:  Bilateral lower conjunctivae with erythema. Minor puffiness to the lower lids. No current drainage. Sclerae clear. Anterior chamber is clear  Neck: Normal range of motion.  Neck supple.  Cardiovascular: Normal rate and regular rhythm.   Pulmonary/Chest: Effort normal. No respiratory distress.  Lungs are clear. Respirations with prolonged expiratory phase  Lymphadenopathy:    She has no cervical adenopathy.  Neurological: She is alert and oriented to person, place, and time. She exhibits normal muscle tone.  Skin: Skin is warm and dry.  Nursing note and vitals reviewed.   ED Course  Procedures (including critical care time) Labs Review Labs Reviewed - No data to display  Imaging Review No results found.   MDM   1. Allergic conjunctivitis, bilateral   2. Other seasonal allergic rhinitis    Zaditor eye drops 1 each eye twice a day. Allegra or clairtin Nasal saine spray Drink lots of fluids.    Hayden Rasmussen, NP 04/01/15 1843  Hayden Rasmussen, NP 04/01/15 [phone]

## 2015-04-01 NOTE — Discharge Instructions (Signed)
Allergic Conjunctivitis Zaditor eye drops 1 each eye twice a day. The conjunctiva is a thin membrane that covers the visible white part of the eyeball and the underside of the eyelids. This membrane protects and lubricates the eye. The membrane has small blood vessels running through it that can normally be seen. When the conjunctiva becomes inflamed, the condition is called conjunctivitis. In response to the inflammation, the conjunctival blood vessels become swollen. The swelling results in redness in the normally white part of the eye. The blood vessels of this membrane also react when a person has allergies and is then called allergic conjunctivitis. This condition usually lasts for as long as the allergy persists. Allergic conjunctivitis cannot be passed to another person (non-contagious). The likelihood of bacterial infection is great and the cause is not likely due to allergies if the inflamed eye has:  A sticky discharge.  Discharge or sticking together of the lids in the morning.  Scaling or flaking of the eyelids where the eyelashes come out.  Red swollen eyelids. CAUSES   Viruses.  Irritants such as foreign bodies.  Chemicals.  General allergic reactions.  Inflammation or serious diseases in the inside or the outside of the eye or the orbit (the boney cavity in which the eye sits) can cause a "red eye." SYMPTOMS   Eye redness.  Tearing.  Itchy eyes.  Burning feeling in the eyes.  Clear drainage from the eye.  Allergic reaction due to pollens or ragweed sensitivity. Seasonal allergic conjunctivitis is frequent in the spring when pollens are in the air and in the fall. DIAGNOSIS  This condition, in its many forms, is usually diagnosed based on the history and an ophthalmological exam. It usually involves both eyes. If your eyes react at the same time every year, allergies may be the cause. While most "red eyes" are due to allergy or an infection, the role of an eye  (ophthalmological) exam is important. The exam can rule out serious diseases of the eye or orbit. TREATMENT   Non-antibiotic eye drops, ointments, or medications by mouth may be prescribed if the ophthalmologist is sure the conjunctivitis is due to allergies alone.  Over-the-counter drops and ointments for allergic symptoms should be used only after other causes of conjunctivitis have been ruled out, or as your caregiver suggests. Medications by mouth are often prescribed if other allergy-related symptoms are present. If the ophthalmologist is sure that the conjunctivitis is due to allergies alone, treatment is normally limited to drops or ointments to reduce itching and burning. HOME CARE INSTRUCTIONS   Wash hands before and after applying drops or ointments, or touching the inflamed eye(s) or eyelids.  Do not let the eye dropper tip or ointment tube touch the eyelid when putting medicine in your eye.  Stop using your soft contact lenses and throw them away. Use a new pair of lenses when recovery is complete. You should run through sterilizing cycles at least three times before use after complete recovery if the old soft contact lenses are to be used. Hard contact lenses should be stopped. They need to be thoroughly sterilized before use after recovery.  Itching and burning eyes due to allergies is often relieved by using a cool cloth applied to closed eye(s). SEEK MEDICAL CARE IF:   Your problems do not go away after two or three days of treatment.  Your lids are sticky (especially in the morning when you wake up) or stick together.  Discharge develops. Antibiotics may be needed  either as drops, ointment, or by mouth.  You have extreme light sensitivity.  An oral temperature above 102 F (38.9 C) develops.  Pain in or around the eye or any other visual symptom develops. MAKE SURE YOU:   Understand these instructions.  Will watch your condition.  Will get help right away if you  are not doing well or get worse. Document Released: 10/18/2002 Document Revised: 10/20/2011 Document Reviewed: 09/13/2007 [facility] Patient Information 2015 [place], [place]. This information is not intended to replace advice given to you by your health care provider. Make sure you discuss any questions you have with your health care provider.

## 2015-04-02 ENCOUNTER — Ambulatory Visit: Payer: 59 | Admitting: Dietician

## 2015-04-03 ENCOUNTER — Encounter: Payer: Self-pay | Admitting: Family Medicine

## 2015-04-03 ENCOUNTER — Ambulatory Visit (INDEPENDENT_AMBULATORY_CARE_PROVIDER_SITE_OTHER): Payer: 59 | Admitting: Family Medicine

## 2015-04-03 VITALS — BP 124/76 | HR 73 | Temp 98.1°F | Wt 222.5 lb

## 2015-04-03 DIAGNOSIS — M79671 Pain in right foot: Secondary | ICD-10-CM

## 2015-04-03 DIAGNOSIS — H5789 Other specified disorders of eye and adnexa: Secondary | ICD-10-CM | POA: Insufficient documentation

## 2015-04-03 DIAGNOSIS — H578 Other specified disorders of eye and adnexa: Secondary | ICD-10-CM | POA: Diagnosis not present

## 2015-04-03 MED ORDER — AZELASTINE HCL 0.05 % OP SOLN: 1.0000 [drp] | Freq: Two times a day (BID) | OPHTHALMIC | Status: DC

## 2015-04-03 NOTE — Progress Notes (Signed)
Pre visit review using our clinic review tool, if applicable. No additional management support is needed unless otherwise documented below in the visit note. 

## 2015-04-03 NOTE — Assessment & Plan Note (Signed)
New- consistent with allergic conjunctivitis. eRx sent for antihistamine eye drops. ? Getting new eye make up. Call or return to clinic prn if these symptoms worsen or fail to improve as anticipated. The patient indicates understanding of these issues and agrees with the plan.

## 2015-04-03 NOTE — Patient Instructions (Signed)
Great to see you. Use eye drops as directed. Wear more supportive shoes and call me with an update.  Happy birthday!

## 2015-04-03 NOTE — Progress Notes (Signed)
Subjective:   Patient ID: [name], female    DOB: 05/15/1966, 49 y.o.   MRN: [id number]  [name] is a pleasant 49 y.o. year old female who presents to clinic today with eye redness and Foot Pain  on 04/03/2015  HPI:  Bilateral eye pain/irritation- 5 days of bilateral eye redness- started in the right now, now involves both eyes. Very itchy, watery.  Not mattered shut in the mornings.  No eye pain.  Only mild photophobia with bring lights.  No blurred vision.  Has not tried anything for this.  No changes in soaps, detergents or make up.  Has never had anything like this before.  Right foot pain- sometimes left as well.  Hurts when you "squeeze them." Does not feel like her plantar fasciitis pain.  No swelling, warmth or erythema. Has been wearing flip flops more frequently.  Current Outpatient Prescriptions on File Prior to Visit  Medication Sig Dispense Refill  . diphenhydrAMINE (BENADRYL) 25 MG tablet Take 25 mg by mouth at bedtime as needed for itching.    . hydrochlorothiazide (MICROZIDE) 12.5 MG capsule Take 1 capsule (12.5 mg total) by mouth daily. 90 capsule 3  . vitamin B-12 (CYANOCOBALAMIN) 1000 MCG tablet Take 1,000 mcg by mouth every 30 (thirty) days.    . Vitamin D, Ergocalciferol, (DRISDOL) 50000 UNITS CAPS capsule TAKE ONE CAPSULE BY MOUTH ONCE A WEEK 6 capsule 0   No current facility-administered medications on file prior to visit.    Allergies  Allergen Reactions  . Codeine   . Latex     Past Medical History  Diagnosis Date  . Anemia   . SVT (supraventricular tachycardia)   . Smoker   . Obesity   . Stress fracture   . Vitamin D deficiency   . Arthritis   . HTN (hypertension)   . SVT (supraventricular tachycardia)   . Arthritis   . Abnormal uterine bleeding   . Obesity     Past Surgical History  Procedure Laterality Date  . Cesarean section      x 2  . Salpingectomy      right; adhesions  . Ovarian cyst surgery      left  .  Tubal ligation    . Knee surgery      right x 2  . Appendectomy    . Hydrothermal ablation      uteral and cyst removal  . Hysteroscopy    . Left oophorectomy    . Dermoid cyst  excision      Family History  Problem Relation Age of Onset  . Diabetes Brother   . Seizures Brother   . Colon cancer Neg Hx   . Cancer Maternal Grandmother      gallbladder  . Stroke Paternal Grandfather   . Stroke Brother   . Colon polyps Mother   . Heart disease Father   . Diabetes Father     Social History   Social History  . Marital Status: Married    Spouse Name: N/A  . Number of Children: 2  . Years of Education: N/A   Occupational History  . 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 Smoking status: Current Every Day Smoker -- 1.00 packs/day for 25 years    Types: Cigarettes  . Smokeless tobacco: Never Used  . Alcohol Use: No  . Drug Use: No  . Sexual Activity:    Partners: Male    Birth  Control/ Protection: Surgical   Other Topics Concern  . Not on file   Social History Narrative   The PMH, PSH, Social History, Family History, Medications, and allergies have been reviewed in [facility], and have been updated if relevant.    Review of Systems  Constitutional: Negative.   HENT: Negative.   Eyes: Positive for photophobia, redness and itching. Negative for pain, discharge and visual disturbance.  Respiratory: Negative.   Cardiovascular: Negative.   Gastrointestinal: Negative.   Endocrine: Negative.   Genitourinary: Negative.   Musculoskeletal: Positive for arthralgias.  Skin: Negative.   Allergic/Immunologic: Negative.   Neurological: Negative.   Hematological: Negative.   Psychiatric/Behavioral: Negative.   All other systems reviewed and are negative.      Objective:    BP 124/76 mmHg  Pulse 73  Temp(Src) 98.1 F (36.7 C) (Oral)  Wt 222 lb 8 oz (100.925 kg)  SpO2 97%   Physical Exam  Constitutional: She is oriented to person, place, and  time. She appears well-developed and well-nourished. No distress.  HENT:  Head: Normocephalic and atraumatic.  Eyes: Pupils are equal, round, and reactive to light. Right eye exhibits no discharge. Left eye exhibits no discharge. Right conjunctiva is injected. Left conjunctiva is injected. No scleral icterus. Right eye exhibits normal extraocular motion and no nystagmus. Left eye exhibits normal extraocular motion and no nystagmus.  Cardiovascular: Normal rate.   Pulmonary/Chest: Effort normal.  Musculoskeletal:       Right foot: There is tenderness. There is normal range of motion, no bony tenderness, no swelling, normal capillary refill, no crepitus, no deformity and no laceration.  + pes planus bilaterally, mild tenderness with squeezing foot on both sides  Neurological: She is alert and oriented to person, place, and time. No cranial nerve deficit.  Skin: Skin is warm and dry.  Psychiatric: She has a normal mood and affect. Her behavior is normal. Judgment and thought content normal.  Nursing note and vitals reviewed.         Assessment & Plan:   Eye redness  Right foot pain No Follow-up on file.

## 2015-04-03 NOTE — Assessment & Plan Note (Signed)
New- Arches are falling.  Advised wearing more supportive shoes. May benefit from orthotics. Call or return to clinic prn if these symptoms worsen or fail to improve as anticipated. The patient indicates understanding of these issues and agrees with the plan.

## 2015-04-21 ENCOUNTER — Other Ambulatory Visit: Payer: Self-pay | Admitting: Family Medicine

## 2015-04-23 NOTE — Telephone Encounter (Signed)
Last office visit 04/03/2015.  Last refilled 03/20/2015 for #6 capsules with no refill.  Last Vit D level 02/23/2015.  Refill?

## 2015-04-26 ENCOUNTER — Other Ambulatory Visit: Payer: Self-pay | Admitting: Family Medicine

## 2015-04-26 DIAGNOSIS — E559 Vitamin D deficiency, unspecified: Secondary | ICD-10-CM

## 2015-04-27 ENCOUNTER — Telehealth: Payer: Self-pay | Admitting: Obstetrics & Gynecology

## 2015-04-27 ENCOUNTER — Encounter: Payer: 59 | Admitting: Obstetrics & Gynecology

## 2015-04-27 ENCOUNTER — Ambulatory Visit (HOSPITAL_COMMUNITY)
Admission: RE | Admit: 2015-04-27 | Discharge: 2015-04-27 | Disposition: A | Discharge status: Home / Self Care | Payer: 59 | Source: Ambulatory Visit | Attending: Obstetrics & Gynecology | Admitting: Obstetrics & Gynecology

## 2015-04-27 DIAGNOSIS — R102 Pelvic and perineal pain: Secondary | ICD-10-CM

## 2015-04-27 DIAGNOSIS — Z9889 Other specified postprocedural states: Secondary | ICD-10-CM | POA: Diagnosis not present

## 2015-04-27 NOTE — Telephone Encounter (Addendum)
Faculty Practice OB/GYN Attending Phone Call Documentation  We received a call here at [facility] from [place] with report of lower abdominal/pelvic cramping x 3 days.  Denies any fevers, urinary, GI symptoms; also no bleeding, abnormal discharge or other GYN concerns.  No associated or alleviating factors.  She is worried about possible ovarian cyst and wants an ultrasound for evaluation; patient is s/p endometrial ablation 4 years ago.  Ultrasound was ordered today, will follow up results and manage accordingly.  Addendum  11:50 am  US Transvaginal Non-ob  04/27/2015   CLINICAL DATA:  Pelvic pain.  Recent endometrial ablation.  EXAM: TRANSABDOMINAL AND TRANSVAGINAL ULTRASOUND OF PELVIS  TECHNIQUE: Both transabdominal and transvaginal ultrasound examinations of the pelvis were performed. Transabdominal technique was performed for global imaging of the pelvis including uterus, ovaries, adnexal regions, and pelvic cul-de-sac. It was necessary to proceed with endovaginal exam following the transabdominal exam to visualize the endometrium.  COMPARISON:  12/09/2013  FINDINGS: Uterus  Measurements: 7.1 x 4.6 x 4.7 cm. Small posterior fibroid is identified measuring 1.3 x 1.1 x 1.0 cm.  Endometrium  Thickness: 5 mm. Appears heterogeneous with trace intra luminal fluid noted.  Right ovary  Measurements: 2.0 x 1.0 x 1.5 cm. Small echogenic structure within the right ovary measures 6 x 6 x 5 mm.  Left ovary  Measurements: Surgically absent. No adnexal mass.  Other findings  No free fluid.  IMPRESSION: 1. Heterogeneous appearance of the endometrium containing trace intraluminal fluid. 2. Small posterior myometrial fibroid. 3. Small echogenic structure within the right ovary may represent calcification or small dermoid.   Electronically Signed   By: Signa Kell M.D.   On: 04/27/2015 11:40   US Pelvis Complete  04/27/2015   CLINICAL DATA:  Pelvic pain.  Recent endometrial  ablation.  EXAM: TRANSABDOMINAL AND TRANSVAGINAL ULTRASOUND OF PELVIS  TECHNIQUE: Both transabdominal and transvaginal ultrasound examinations of the pelvis were performed. Transabdominal technique was performed for global imaging of the pelvis including uterus, ovaries, adnexal regions, and pelvic cul-de-sac. It was necessary to proceed with endovaginal exam following the transabdominal exam to visualize the endometrium.  COMPARISON:  12/09/2013  FINDINGS: Uterus  Measurements: 7.1 x 4.6 x 4.7 cm. Small posterior fibroid is identified measuring 1.3 x 1.1 x 1.0 cm.  Endometrium  Thickness: 5 mm. Appears heterogeneous with trace intra luminal fluid noted.  Right ovary  Measurements: 2.0 x 1.0 x 1.5 cm. Small echogenic structure within the right ovary measures 6 x 6 x 5 mm.  Left ovary  Measurements: Surgically absent. No adnexal mass.  Other findings  No free fluid.  IMPRESSION: 1. Heterogeneous appearance of the endometrium containing trace intraluminal fluid. 2. Small posterior myometrial fibroid. 3. Small echogenic structure within the right ovary may represent calcification or small dermoid.   Electronically Signed   By: Signa Kell M.D.   On: 04/27/2015 11:40   Results reviewed with patient; she was reassured.  Told to call or come in for any worsening symptoms.   Jaynie Collins, MD, FACOG Attending Obstetrician & Gynecologist, [facility], [facility] and [facility] [employer]

## 2015-04-30 NOTE — Progress Notes (Signed)
This encounter was created in error - please disregard.

## 2015-05-03 ENCOUNTER — Ambulatory Visit (INDEPENDENT_AMBULATORY_CARE_PROVIDER_SITE_OTHER): Payer: 59 | Admitting: *Deleted

## 2015-05-03 ENCOUNTER — Other Ambulatory Visit (INDEPENDENT_AMBULATORY_CARE_PROVIDER_SITE_OTHER): Payer: 59

## 2015-05-03 DIAGNOSIS — E559 Vitamin D deficiency, unspecified: Secondary | ICD-10-CM

## 2015-05-03 DIAGNOSIS — E538 Deficiency of other specified B group vitamins: Secondary | ICD-10-CM

## 2015-05-03 MED ORDER — CYANOCOBALAMIN 1000 MCG/ML IJ SOLN
1000.0000 ug | Freq: Once | INTRAMUSCULAR | Status: AC
  Administered 2015-05-03: 1000 ug via INTRAMUSCULAR

## 2015-05-04 LAB — VITAMIN D 25 HYDROXY (VIT D DEFICIENCY, FRACTURES): VITD: 32.47 ng/mL (ref 30.00–100.00)

## 2015-05-08 ENCOUNTER — Encounter: Payer: Self-pay | Admitting: Family Medicine

## 2015-05-11 ENCOUNTER — Ambulatory Visit (INDEPENDENT_AMBULATORY_CARE_PROVIDER_SITE_OTHER): Payer: 59 | Admitting: Obstetrics & Gynecology

## 2015-05-11 ENCOUNTER — Encounter: Payer: Self-pay | Admitting: Obstetrics & Gynecology

## 2015-05-11 VITALS — BP 123/85 | HR 74 | Ht 66.0 in | Wt 224.0 lb

## 2015-05-11 DIAGNOSIS — R350 Frequency of micturition: Secondary | ICD-10-CM

## 2015-05-11 DIAGNOSIS — Z0000 Encounter for general adult medical examination without abnormal findings: Secondary | ICD-10-CM

## 2015-05-11 DIAGNOSIS — Z1151 Encounter for screening for human papillomavirus (HPV): Secondary | ICD-10-CM | POA: Diagnosis not present

## 2015-05-11 DIAGNOSIS — Z01419 Encounter for gynecological examination (general) (routine) without abnormal findings: Secondary | ICD-10-CM | POA: Diagnosis not present

## 2015-05-11 DIAGNOSIS — Z124 Encounter for screening for malignant neoplasm of cervix: Secondary | ICD-10-CM

## 2015-05-11 LAB — POCT URINALYSIS DIPSTICK
Bilirubin, UA: NEGATIVE
Glucose, UA: NEGATIVE
Ketones, UA: NEGATIVE
Leukocytes, UA: NEGATIVE
NITRITE UA: NEGATIVE
PH UA: 5
Protein, UA: NEGATIVE
SPEC GRAV UA: 1.025
UROBILINOGEN UA: 0.2

## 2015-05-11 NOTE — Progress Notes (Signed)
Subjective:    [name] is a 49 y.o. MW P2 (21 and 49 y kids)  female who presents for an annual exam. The patient has no complaints today. The patient is sexually active. GYN screening history: last pap: was normal. The patient wears seatbelts: yes. The patient participates in regular exercise: 'sometimes" . Has the patient ever been transfused or tattooed?: no. The patient reports that there is not domestic violence in her life.   Menstrual History: OB History    Gravida Para Term Preterm AB TAB SAB Ectopic Multiple Living   Menarche age: 2  No LMP recorded. Patient has had an ablation.    The following portions of the patient's history were reviewed and updated as appropriate: allergies, current medications, past family history, past medical history, past social history, past surgical history and problem list.  Review of Systems Pertinent items are noted in HPI. Married for 24 years. Denies dysparenia. Declines flu vaccine. Mammogram UTD. Works at [employer] (with concealed carry).   Objective:    BP 123/85 mmHg  Pulse 74  Ht  (1.676 m)  Wt 224 lb (101.606 kg)  BMI 36.17 kg/m2  General Appearance:    Alert, cooperative, no distress, appears stated age  Head:    Normocephalic, without obvious abnormality, atraumatic  Eyes:    PERRL, conjunctiva/corneas clear, EOM's intact, fundi    benign, both eyes  Ears:    Normal TM's and external ear canals, both ears  Nose:   Nares normal, septum midline, mucosa normal, no drainage    or sinus tenderness  Throat:   Lips, mucosa, and tongue normal; teeth and gums normal  Neck:   Supple, symmetrical, trachea midline, no adenopathy;    thyroid:  no enlargement/tenderness/nodules; no carotid   bruit or JVD  Back:     Symmetric, no curvature, ROM normal, no CVA tenderness  Lungs:     Clear to auscultation bilaterally, respirations unlabored  Chest Wall:    No tenderness or deformity   Heart:    Regular  rate and rhythm, S1 and S2 normal, no murmur, rub   or gallop  Breast Exam:    No tenderness, masses, or nipple abnormality  Abdomen:     Soft, non-tender, bowel sounds active all four quadrants,    no masses, no organomegaly  Genitalia:    Normal female without lesion, discharge or tenderness, NSSA, NT, mobile, normal adnexal exam     Extremities:   Extremities normal, atraumatic, no cyanosis or edema  Pulses:   2+ and symmetric all extremities  Skin:   Skin color, texture, turgor normal, no rashes or lesions  Lymph nodes:   Cervical, supraclavicular, and axillary nodes normal  Neurologic:   CNII-XII intact, normal strength, sensation and reflexes    throughout  .    Assessment:    Healthy female exam.   Urinary frequency   Plan:     Breast self exam technique reviewed and patient encouraged to perform self-exam monthly. Thin prep Pap smear. Urine culture and sensitivity.

## 2015-05-11 NOTE — Progress Notes (Signed)
Pt c/o pressure and discomfort with urination, UA + blood, will send urine cx.

## 2015-05-13 LAB — URINE CULTURE
COLONY COUNT: NO GROWTH
Organism ID, Bacteria: NO GROWTH

## 2015-05-15 LAB — CYTOLOGY - PAP

## 2015-05-17 ENCOUNTER — Other Ambulatory Visit (INDEPENDENT_AMBULATORY_CARE_PROVIDER_SITE_OTHER): Payer: 59 | Admitting: *Deleted

## 2015-05-17 DIAGNOSIS — M545 Low back pain, unspecified: Secondary | ICD-10-CM

## 2015-05-18 LAB — POCT URINALYSIS DIPSTICK
BILIRUBIN UA: NEGATIVE
Glucose, UA: NEGATIVE
KETONES UA: NEGATIVE
Leukocytes, UA: NEGATIVE
Nitrite, UA: NEGATIVE
PH UA: 5
Protein, UA: NEGATIVE
SPEC GRAV UA: 1.01
Urobilinogen, UA: 0.2

## 2015-05-18 LAB — CULTURE, OB URINE: Colony Count: 4000

## 2015-05-18 NOTE — Progress Notes (Signed)
Pt came in office c/o flank pain that radiates down the lower part of her back, had urine cx last week which showed no growth.  UA + for mod blood, sent urine cx.  Informed pt that symptoms could be related to kidney stones will advise with Dr Shawnie Pons in the morning to see what she recommends doing at this time.  Will call pt with recommendations, encouraged to increase water intake at this time.

## 2015-06-07 ENCOUNTER — Ambulatory Visit (INDEPENDENT_AMBULATORY_CARE_PROVIDER_SITE_OTHER): Payer: 59 | Admitting: *Deleted

## 2015-06-07 DIAGNOSIS — E538 Deficiency of other specified B group vitamins: Secondary | ICD-10-CM

## 2015-06-07 MED ORDER — CYANOCOBALAMIN 1000 MCG/ML IJ SOLN
1000.0000 ug | Freq: Once | INTRAMUSCULAR | Status: AC
  Administered 2015-06-07: 1000 ug via INTRAMUSCULAR

## 2015-08-15 ENCOUNTER — Encounter: Payer: Self-pay | Admitting: Primary Care

## 2015-08-15 ENCOUNTER — Ambulatory Visit (INDEPENDENT_AMBULATORY_CARE_PROVIDER_SITE_OTHER): Payer: 59 | Admitting: Primary Care

## 2015-08-15 VITALS — BP 148/90 | HR 87 | Temp 98.1°F | Ht 66.0 in

## 2015-08-15 DIAGNOSIS — B349 Viral infection, unspecified: Secondary | ICD-10-CM | POA: Diagnosis not present

## 2015-08-15 DIAGNOSIS — J329 Chronic sinusitis, unspecified: Secondary | ICD-10-CM

## 2015-08-15 DIAGNOSIS — B9789 Other viral agents as the cause of diseases classified elsewhere: Secondary | ICD-10-CM

## 2015-08-15 NOTE — Patient Instructions (Addendum)
Your symptoms are likely related to a viral sinus infection.  Nasal Congesiton: Flonase (fluticasone) nasal spray. Instill 2 sprays in each nostril once daily. Also Zyrtec at bedtime will help with congestion and drainage.  Increase consumption of fluids and rest.  Please call me Monday next week if no improvement or symptoms become worse.  It was a pleasure meeting you!  Sinusitis, Adult Sinusitis is redness, soreness, and inflammation of the paranasal sinuses. Paranasal sinuses are air pockets within the bones of your face. They are located beneath your eyes, in the middle of your forehead, and above your eyes. In healthy paranasal sinuses, mucus is able to drain out, and air is able to circulate through them by way of your nose. However, when your paranasal sinuses are inflamed, mucus and air can become trapped. This can allow bacteria and other germs to grow and cause infection. Sinusitis can develop quickly and last only a short time (acute) or continue over a long period (chronic). Sinusitis that lasts for more than 12 weeks is considered chronic. CAUSES Causes of sinusitis include:  Allergies.  Structural abnormalities, such as displacement of the cartilage that separates your nostrils (deviated septum), which can decrease the air flow through your nose and sinuses and affect sinus drainage.  Functional abnormalities, such as when the small hairs (cilia) that line your sinuses and help remove mucus do not work properly or are not present. SIGNS AND SYMPTOMS Symptoms of acute and chronic sinusitis are the same. The primary symptoms are pain and pressure around the affected sinuses. Other symptoms include:  Upper toothache.  Earache.  Headache.  Bad breath.  Decreased sense of smell and taste.  A cough, which worsens when you are lying flat.  Fatigue.  Fever.  Thick drainage from your nose, which often is green and may contain pus (purulent).  Swelling and warmth over  the affected sinuses. DIAGNOSIS Your health care provider will perform a physical exam. During your exam, your health care provider may perform any of the following to help determine if you have acute sinusitis or chronic sinusitis:  Look in your nose for signs of abnormal growths in your nostrils (nasal polyps).  Tap over the affected sinus to check for signs of infection.  View the inside of your sinuses using an imaging device that has a light attached (endoscope). If your health care provider suspects that you have chronic sinusitis, one or more of the following tests may be recommended:  Allergy tests.  Nasal culture. A sample of mucus is taken from your nose, sent to a lab, and screened for bacteria.  Nasal cytology. A sample of mucus is taken from your nose and examined by your health care provider to determine if your sinusitis is related to an allergy. TREATMENT Most cases of acute sinusitis are related to a viral infection and will resolve on their own within 10 days. Sometimes, medicines are prescribed to help relieve symptoms of both acute and chronic sinusitis. These may include pain medicines, decongestants, nasal steroid sprays, or saline sprays. However, for sinusitis related to a bacterial infection, your health care provider will prescribe antibiotic medicines. These are medicines that will help kill the bacteria causing the infection. Rarely, sinusitis is caused by a fungal infection. In these cases, your health care provider will prescribe antifungal medicine. For some cases of chronic sinusitis, surgery is needed. Generally, these are cases in which sinusitis recurs more than 3 times per year, despite other treatments. HOME CARE INSTRUCTIONS  Drink  plenty of water. Water helps thin the mucus so your sinuses can drain more easily.  Use a humidifier.  Inhale steam 3-4 times a day (for example, sit in the bathroom with the shower running).  Apply a warm, moist washcloth  to your face 3-4 times a day, or as directed by your health care provider.  Use saline nasal sprays to help moisten and clean your sinuses.  Take medicines only as directed by your health care provider.  If you were prescribed either an antibiotic or antifungal medicine, finish it all even if you start to feel better. SEEK IMMEDIATE MEDICAL CARE IF:  You have increasing pain or severe headaches.  You have nausea, vomiting, or drowsiness.  You have swelling around your face.  You have vision problems.  You have a stiff neck.  You have difficulty breathing.   This information is not intended to replace advice given to you by your health care provider. Make sure you discuss any questions you have with your health care provider.   Document Released: 07/28/2005 Document Revised: 08/18/2014 Document Reviewed: 08/12/2011 Elsevier Interactive Patient Education [employer]

## 2015-08-15 NOTE — Progress Notes (Signed)
Subjective:    Patient ID: [name], female    DOB: [birth year], 50 y.o.   MRN: [id number]  HPI  [name] is a 50 year old female who presents today with a chief complaint of nasal congestion. She also reports headaches, facial pressure, fatigue, mild cough, low grade fever, chills. Her symptoms have been present since for the past 5 days with some improvement in symptoms overall. She's taking dimetap DM with some improvement. Both her son and husband were ill with the same symptoms and have since recovered.  Review of Systems  Constitutional: Negative for fever and chills.  HENT: Positive for congestion and sinus pressure. Negative for ear pain.   Respiratory: Positive for cough. Negative for shortness of breath.   Cardiovascular: Negative for chest pain.  Gastrointestinal: Negative for nausea.  Neurological: Positive for headaches.       Past Medical History  Diagnosis Date  . Anemia   . SVT (supraventricular tachycardia) (HCC)   . Smoker   . Obesity   . Stress fracture   . Vitamin D deficiency   . Arthritis   . HTN (hypertension)   . SVT (supraventricular tachycardia) (HCC)   . Arthritis   . Abnormal uterine bleeding   . Obesity     Social History   Social History  . Marital Status: Married    Spouse Name: N/A  . Number of Children: 2  . Years of Education: N/A   Occupational History  . 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 Smoking status: Current Every Day Smoker -- 1.00 packs/day for 25 years    Types: Cigarettes  . Smokeless tobacco: Never Used  . Alcohol Use: No  . Drug Use: No  . Sexual Activity:    Partners: Male    Birth Control/ Protection: Surgical   Other Topics Concern  . Not on file   Social History Narrative    Past Surgical History  Procedure Laterality Date  . Cesarean section      x 2  . Salpingectomy      right; adhesions  . Ovarian cyst surgery      left  . Tubal ligation    . Knee surgery     right x 2  . Appendectomy    . Hydrothermal ablation      uteral and cyst removal  . Hysteroscopy    . Left oophorectomy    . Dermoid cyst  excision      Family History  Problem Relation Age of Onset  . Diabetes Brother   . Seizures Brother   . Colon cancer Neg Hx   . Cancer Maternal Grandmother      gallbladder  . Stroke Paternal Grandfather   . Stroke Brother   . Colon polyps Mother   . Heart disease Father   . Diabetes Father     Allergies  Allergen Reactions  . Codeine   . Latex     Current Outpatient Prescriptions on File Prior to Visit  Medication Sig Dispense Refill  . diphenhydrAMINE (BENADRYL) 25 MG tablet Take 25 mg by mouth at bedtime as needed for itching.    . hydrochlorothiazide (MICROZIDE) 12.5 MG capsule Take 1 capsule (12.5 mg total) by mouth daily. 90 capsule 3  . vitamin B-12 (CYANOCOBALAMIN) 1000 MCG tablet Take 1,000 mcg by mouth every 30 (thirty) days.    . Vitamin D, Ergocalciferol, (DRISDOL) 50000 UNITS CAPS capsule TAKE ONE CAPSULE BY MOUTH  ONCE A WEEK 6 capsule 0   No current facility-administered medications on file prior to visit.    BP 148/90 mmHg  Pulse 87  Temp(Src) 98.1 F (36.7 C) (Oral)  Ht 5\' 6"  (1.676 m)  SpO2 98%    Objective:   Physical Exam  Constitutional: She appears well-nourished.  HENT:  Right Ear: Tympanic membrane and ear canal normal.  Left Ear: Tympanic membrane and ear canal normal.  Nose: Right sinus exhibits maxillary sinus tenderness. Right sinus exhibits no frontal sinus tenderness. Left sinus exhibits maxillary sinus tenderness. Left sinus exhibits no frontal sinus tenderness.  Mouth/Throat: Oropharynx is clear and moist.  Minor facial tenderness upon exam.  Eyes: Conjunctivae are normal.  Neck: Neck supple.  Cardiovascular: Normal rate and regular rhythm.   Pulmonary/Chest: Effort normal and breath sounds normal. She has no wheezes. She has no rales.  Lymphadenopathy:    She has no cervical  adenopathy.  Skin: Skin is warm and dry.          Assessment & Plan:  Viral Sinusitis:  Facial pressure, nasal congestion, mild cough x 5 days. No fevers, chills, production to cough. Exam with minor tenderness to maxillary sinuses, otherwise unremarkable. Suspect viral involvement at this point as symptoms have been present for 5 days and she's feeling slightly improved.  Treat with supportive measures, flonase, zyrtec HS. She is to call Monday next week if no improvement or symptoms progress.

## 2015-08-15 NOTE — Progress Notes (Signed)
Pre visit review using our clinic review tool, if applicable. No additional management support is needed unless otherwise documented below in the visit note. 

## 2015-09-20 ENCOUNTER — Ambulatory Visit (INDEPENDENT_AMBULATORY_CARE_PROVIDER_SITE_OTHER): Payer: 59 | Admitting: Family Medicine

## 2015-09-20 ENCOUNTER — Encounter: Payer: Self-pay | Admitting: Family Medicine

## 2015-09-20 VITALS — BP 110/72 | HR 85 | Temp 97.5°F | Ht 66.0 in | Wt 228.2 lb

## 2015-09-20 DIAGNOSIS — N3289 Other specified disorders of bladder: Secondary | ICD-10-CM | POA: Insufficient documentation

## 2015-09-20 LAB — POC URINALSYSI DIPSTICK (AUTOMATED)
BILIRUBIN UA: NEGATIVE
Glucose, UA: NEGATIVE
KETONES UA: NEGATIVE
Leukocytes, UA: NEGATIVE
Nitrite, UA: NEGATIVE
PH UA: 6
Protein, UA: NEGATIVE
RBC UA: NEGATIVE
SPEC GRAV UA: 1.015
Urobilinogen, UA: 0.2

## 2015-09-20 NOTE — Progress Notes (Signed)
Pre visit review using our clinic review tool, if applicable. No additional management support is needed unless otherwise documented below in the visit note. 

## 2015-09-20 NOTE — Patient Instructions (Signed)
Stop smoking.  Decrease/stop soda and increase water intake.  Avoid bladder irritants.  Call if not improving in next 2 weeks.

## 2015-09-20 NOTE — Assessment & Plan Note (Addendum)
No sign of blood  or infection in urine. Mildly concentrated urine. No glucose in urine.  Most likely secondary to decreased water intake and soda.

## 2015-09-20 NOTE — Addendum Note (Signed)
Addended by: Damita Lack on: 09/20/2015 03:02 PM   Modules accepted: Orders

## 2015-09-20 NOTE — Progress Notes (Signed)
   Subjective:    Patient ID: [name], female    DOB: 02-24-1966, 50 y.o.   MRN: [id number]  Urinary Tract Infection  This is a new problem. The current episode started yesterday. The problem has been gradually worsening. Quality: pressure in lower abdomen with urination, no dysuria. The pain is mild. There has been no fever. She is sexually active. There is no history of pyelonephritis. Associated symptoms include frequency. Pertinent negatives include no discharge, hematuria, hesitancy, nausea, possible pregnancy, urgency or vomiting. Associated symptoms comments: Urine odor. She has tried nothing for the symptoms. There is no history of catheterization, kidney stones, recurrent UTIs, a single kidney, urinary stasis or a urological procedure.    Social History /Family History/Past Medical History reviewed and updated if needed.   Review of Systems  Gastrointestinal: Negative for nausea and vomiting.  Genitourinary: Positive for frequency. Negative for hesitancy, urgency and hematuria.       Objective:   Physical Exam  Constitutional: Vital signs are normal. She appears well-developed and well-nourished. She is cooperative.  Non-toxic appearance. She does not appear ill. No distress.  Obese, central obesity  HENT:  Head: Normocephalic.  Right Ear: Hearing, tympanic membrane, external ear and ear canal normal. Tympanic membrane is not erythematous, not retracted and not bulging.  Left Ear: Hearing, tympanic membrane, external ear and ear canal normal. Tympanic membrane is not erythematous, not retracted and not bulging.  Nose: No mucosal edema or rhinorrhea. Right sinus exhibits no maxillary sinus tenderness and no frontal sinus tenderness. Left sinus exhibits no maxillary sinus tenderness and no frontal sinus tenderness.  Mouth/Throat: Uvula is midline, oropharynx is clear and moist and mucous membranes are normal.  Eyes: Conjunctivae, EOM and lids are normal. Pupils are equal,  round, and reactive to light. Lids are everted and swept, no foreign bodies found.  Neck: Trachea normal and normal range of motion. Neck supple. Carotid bruit is not present. No thyroid mass and no thyromegaly present.  Cardiovascular: Normal rate, regular rhythm, S1 normal, S2 normal, normal heart sounds, intact distal pulses and normal pulses.  Exam reveals no gallop and no friction rub.   No murmur heard. Pulmonary/Chest: Effort normal and breath sounds normal. No tachypnea. No respiratory distress. She has no decreased breath sounds. She has no wheezes. She has no rhonchi. She has no rales.  Abdominal: Soft. Normal appearance and bowel sounds are normal. There is no hepatosplenomegaly. There is no tenderness. There is no CVA tenderness.  Neurological: She is alert.  Skin: Skin is warm, dry and intact. No rash noted.  Psychiatric: Her speech is normal and behavior is normal. Judgment and thought content normal. Her mood appears not anxious. Cognition and memory are normal. She does not exhibit a depressed mood.          Assessment & Plan:

## 2015-09-25 ENCOUNTER — Encounter: Payer: Self-pay | Admitting: Family Medicine

## 2015-09-25 ENCOUNTER — Ambulatory Visit (INDEPENDENT_AMBULATORY_CARE_PROVIDER_SITE_OTHER): Payer: 59 | Admitting: Family Medicine

## 2015-09-25 VITALS — BP 124/78 | HR 81 | Temp 98.1°F | Wt 227.5 lb

## 2015-09-25 DIAGNOSIS — R300 Dysuria: Secondary | ICD-10-CM | POA: Diagnosis not present

## 2015-09-25 LAB — POC URINALSYSI DIPSTICK (AUTOMATED)
BILIRUBIN UA: NEGATIVE
GLUCOSE UA: NEGATIVE
Ketones, UA: NEGATIVE
Nitrite, UA: NEGATIVE
Protein, UA: NEGATIVE
UROBILINOGEN UA: 0.2
pH, UA: 6

## 2015-09-25 MED ORDER — CIPROFLOXACIN HCL 500 MG PO TABS: 500.0000 mg | ORAL_TABLET | Freq: Two times a day (BID) | ORAL | Status: DC

## 2015-09-25 NOTE — Patient Instructions (Signed)

## 2015-09-25 NOTE — Progress Notes (Signed)
Pre visit review using our clinic review tool, if applicable. No additional management support is needed unless otherwise documented below in the visit note. 

## 2015-09-25 NOTE — Assessment & Plan Note (Signed)
New- UA pos for LE and RBCs, new since 2/9. Treat for presumed UTI with cipro 500 mg twice daily x 3 days, send urine for cx. Push fluids, AZO as needed for discomfort. Call or return to clinic prn if these symptoms worsen or fail to improve as anticipated. The patient indicates understanding of these issues and agrees with the plan.

## 2015-09-25 NOTE — Addendum Note (Signed)
Addended by: Sydell Axon C on: 09/25/2015 11:03 AM   Modules accepted: Orders

## 2015-09-25 NOTE — Progress Notes (Signed)
Subjective:   Patient ID: [name], female    DOB: 09/18/1965, 50 y.o.   MRN: [id number]  [name] is a pleasant 50 y.o. year old female who presents to clinic today with burning with urination  on 09/25/2015  HPI:  Saw my partner, Dr. Ermalene Searing, 5 days ago on 09/19/14 for suprapubic pressure without dysuria.  Note reviewed- UA neg. Advised likely bladder spasm and to drink more fluids.  Returns today because now having more dysuria and increased urinary frequency.  No back pain.  No nausea or vomiting.  No fever or hematuria.  Current Outpatient Prescriptions on File Prior to Visit  Medication Sig Dispense Refill  . diphenhydrAMINE (BENADRYL) 25 MG tablet Take 25 mg by mouth at bedtime as needed for itching.    . hydrochlorothiazide (MICROZIDE) 12.5 MG capsule Take 1 capsule (12.5 mg total) by mouth daily. 90 capsule 3  . Vitamin D, Ergocalciferol, (DRISDOL) 50000 UNITS CAPS capsule TAKE ONE CAPSULE BY MOUTH ONCE A WEEK 6 capsule 0   No current facility-administered medications on file prior to visit.    Allergies  Allergen Reactions  . Codeine   . Latex     Past Medical History  Diagnosis Date  . Anemia   . SVT (supraventricular tachycardia) (HCC)   . Smoker   . Obesity   . Stress fracture   . Vitamin D deficiency   . Arthritis   . HTN (hypertension)   . SVT (supraventricular tachycardia) (HCC)   . Arthritis   . Abnormal uterine bleeding   . Obesity     Past Surgical History  Procedure Laterality Date  . Cesarean section      x 2  . Salpingectomy      right; adhesions  . Ovarian cyst surgery      left  . Tubal ligation    . Knee surgery      right x 2  . Appendectomy    . Hydrothermal ablation      uteral and cyst removal  . Hysteroscopy    . Left oophorectomy    . Dermoid cyst  excision      Family History  Problem Relation Age of Onset  . Diabetes Brother   . Seizures Brother   . Colon cancer Neg Hx   . Cancer Maternal Grandmother       gallbladder  . Stroke Paternal Grandfather   . Stroke Brother   . Colon polyps Mother   . Heart disease Father   . Diabetes Father     Social History   Social History  . Marital Status: Married    Spouse Name: N/A  . Number of Children: 2  . Years of Education: N/A   Occupational History  . 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 Smoking status: Current Every Day Smoker -- 1.00 packs/day for 25 years    Types: Cigarettes  . Smokeless tobacco: Never Used  . Alcohol Use: No  . Drug Use: No  . Sexual Activity:    Partners: Male    Birth Control/ Protection: Surgical   Other Topics Concern  . Not on file   Social History Narrative   The PMH, PSH, Social History, Family History, Medications, and allergies have been reviewed in [facility], and have been updated if relevant.   Review of Systems  Constitutional: Negative.   Gastrointestinal: Negative.   Genitourinary: Positive for dysuria, frequency and decreased urine volume. Negative for  hematuria, flank pain, vaginal bleeding, vaginal discharge, enuresis, genital sores and menstrual problem.  Skin: Negative.   Neurological: Negative.   Hematological: Negative.   All other systems reviewed and are negative.      Objective:    BP 124/78 mmHg  Pulse 81  Temp(Src) 98.1 F (36.7 C) (Oral)  Wt 227 lb 8 oz (103.193 kg)   Physical Exam  Constitutional: She is oriented to person, place, and time. She appears well-developed and well-nourished. No distress.  HENT:  Head: Normocephalic.  Eyes: Conjunctivae are normal.  Cardiovascular: Normal rate.   Pulmonary/Chest: Effort normal.  Abdominal: Soft. She exhibits no distension and no mass. There is no tenderness. There is no rebound and no guarding.  Musculoskeletal:  No CVA tenderness  Neurological: She is alert and oriented to person, place, and time. No cranial nerve deficit.  Skin: Skin is warm and dry. She is not diaphoretic.  Psychiatric:  She has a normal mood and affect. Her behavior is normal. Thought content normal.  Nursing note and vitals reviewed.         Assessment & Plan:   Burning with urination - Plan: POCT Urinalysis Dipstick (Automated) No Follow-up on file.

## 2015-09-27 ENCOUNTER — Telehealth: Payer: Self-pay

## 2015-09-27 NOTE — Telephone Encounter (Signed)
We are still awaiting sensitivities.  We will call her when they are back.

## 2015-09-27 NOTE — Telephone Encounter (Signed)
Pt left v/m requesting cb when urine culture results are available. Pt still having low back pain; pt seen 09/25/15 and pt is taking cipro as prescribed.

## 2015-09-28 LAB — URINE CULTURE

## 2015-10-01 ENCOUNTER — Encounter: Payer: Self-pay | Admitting: Family Medicine

## 2015-10-01 MED ORDER — HYDROCHLOROTHIAZIDE 12.5 MG PO CAPS: 12.5000 mg | ORAL_CAPSULE | Freq: Every day | ORAL | Status: DC

## 2015-10-04 ENCOUNTER — Other Ambulatory Visit: Payer: Self-pay | Admitting: Family Medicine

## 2015-10-04 ENCOUNTER — Encounter: Payer: Self-pay | Admitting: Family Medicine

## 2015-10-04 MED ORDER — OSELTAMIVIR PHOSPHATE 75 MG PO CAPS: 75.0000 mg | ORAL_CAPSULE | Freq: Every day | ORAL | Status: DC

## 2015-10-04 NOTE — Progress Notes (Signed)
Pt called and was prescribed tamiflu; pt wanted to know what kind of med tamiflu was and what should it do; advised pt tamiflu is antiviral med and can help to lessen flu symptoms and also thought to help prevent the flu if pt has been exposed to the flu but does not yet have symptoms of the flu. Pt voiced understanding. Sent FYI to Dr Ermalene Searing.

## 2015-10-09 ENCOUNTER — Encounter: Payer: Self-pay | Admitting: Family Medicine

## 2015-10-09 ENCOUNTER — Ambulatory Visit (INDEPENDENT_AMBULATORY_CARE_PROVIDER_SITE_OTHER): Payer: 59 | Admitting: Family Medicine

## 2015-10-09 VITALS — BP 112/78 | HR 89 | Temp 97.7°F | Ht 66.0 in | Wt 224.0 lb

## 2015-10-09 DIAGNOSIS — R300 Dysuria: Secondary | ICD-10-CM

## 2015-10-09 LAB — POC URINALSYSI DIPSTICK (AUTOMATED)
BILIRUBIN UA: NEGATIVE
Glucose, UA: NEGATIVE
Ketones, UA: NEGATIVE
Leukocytes, UA: NEGATIVE
Nitrite, UA: NEGATIVE
PH UA: 6
Protein, UA: NEGATIVE
Spec Grav, UA: 1.025
Urobilinogen, UA: 0.2

## 2015-10-09 NOTE — Addendum Note (Signed)
Addended by: Damita Lack on: 10/09/2015 02:23 PM   Modules accepted: Orders

## 2015-10-09 NOTE — Progress Notes (Signed)
   Subjective:    Patient ID: [name], female    DOB: 1966-07-06, 50 y.o.   MRN: [id number]  HPI  50 year old female pt of D.r Aron's presents for recurrent urinry symtpoms.   She was seen by myself with nml UA on 2/9, felt bladder spasm.  Pt continued with symptoms of dysuria and returned to see Dr. Dayton Martes on 2/14.  UA at that appt was pos for  for LE and RBCs.  Treated  with cipro x 34 days.  U culture: ecoli, pansensitive.  She reports he symptoms resolved entirely except for light pressure.   Now she returns with dysuria  x24 hours, no frequency or urgency. No blood in urine.  No fever, occ flank pain mild.  no N/V.  Tolerate cipro and took full course.     Review of Systems  Constitutional: Negative for fever and fatigue.  HENT: Negative for ear pain.   Eyes: Negative for pain.  Respiratory: Negative for chest tightness and shortness of breath.   Cardiovascular: Negative for chest pain, palpitations and leg swelling.  Gastrointestinal: Negative for abdominal pain.  Genitourinary: Negative for dysuria.       Objective:   Physical Exam  Constitutional: Vital signs are normal. She appears well-developed and well-nourished. She is cooperative.  Non-toxic appearance. She does not appear ill. No distress.  HENT:  Head: Normocephalic.  Right Ear: Hearing, tympanic membrane, external ear and ear canal normal. Tympanic membrane is not erythematous, not retracted and not bulging.  Left Ear: Hearing, tympanic membrane, external ear and ear canal normal. Tympanic membrane is not erythematous, not retracted and not bulging.  Nose: No mucosal edema or rhinorrhea. Right sinus exhibits no maxillary sinus tenderness and no frontal sinus tenderness. Left sinus exhibits no maxillary sinus tenderness and no frontal sinus tenderness.  Mouth/Throat: Uvula is midline, oropharynx is clear and moist and mucous membranes are normal.  Eyes: Conjunctivae, EOM and lids are normal. Pupils are  equal, round, and reactive to light. Lids are everted and swept, no foreign bodies found.  Neck: Trachea normal and normal range of motion. Neck supple. Carotid bruit is not present. No thyroid mass and no thyromegaly present.  Cardiovascular: Normal rate, regular rhythm, S1 normal, S2 normal, normal heart sounds, intact distal pulses and normal pulses.  Exam reveals no gallop and no friction rub.   No murmur heard. Pulmonary/Chest: Effort normal and breath sounds normal. No tachypnea. No respiratory distress. She has no decreased breath sounds. She has no wheezes. She has no rhonchi. She has no rales.  Abdominal: Soft. Normal appearance and bowel sounds are normal. There is no tenderness.  Neurological: She is alert.  Skin: Skin is warm, dry and intact. No rash noted.  Psychiatric: Her speech is normal and behavior is normal. Judgment and thought content normal. Her mood appears not anxious. Cognition and memory are normal. She does not exhibit a depressed mood.          Assessment & Plan:

## 2015-10-09 NOTE — Patient Instructions (Signed)
We will call with results of culture.  Push fluids, cranberry tablets.  Call if fever, worsening dysuria or flank pain.

## 2015-10-09 NOTE — Assessment & Plan Note (Signed)
Possible partially treated UTI. UA appears clear but will send for culture to verify.  Push fluids and use cranberry till results back.

## 2015-10-09 NOTE — Progress Notes (Signed)
Pre visit review using our clinic review tool, if applicable. No additional management support is needed unless otherwise documented below in the visit note. 

## 2015-10-11 ENCOUNTER — Other Ambulatory Visit: Payer: Self-pay | Admitting: *Deleted

## 2015-10-11 MED ORDER — CIPROFLOXACIN HCL 250 MG PO TABS: 250.0000 mg | ORAL_TABLET | Freq: Two times a day (BID) | ORAL | Status: DC

## 2015-10-12 LAB — URINE CULTURE

## 2015-10-16 ENCOUNTER — Encounter: Payer: Self-pay | Admitting: Family Medicine

## 2015-10-29 ENCOUNTER — Other Ambulatory Visit: Payer: Self-pay | Admitting: Family Medicine

## 2015-10-30 NOTE — Telephone Encounter (Signed)
Is pt needing to continue high dose? 

## 2015-11-19 ENCOUNTER — Other Ambulatory Visit: Payer: Self-pay | Admitting: Family Medicine

## 2015-12-01 ENCOUNTER — Other Ambulatory Visit: Payer: Self-pay | Admitting: Family Medicine

## 2015-12-17 ENCOUNTER — Encounter: Payer: Self-pay | Admitting: Family Medicine

## 2015-12-19 ENCOUNTER — Other Ambulatory Visit: Payer: Self-pay | Admitting: Family Medicine

## 2015-12-19 DIAGNOSIS — Z01419 Encounter for gynecological examination (general) (routine) without abnormal findings: Secondary | ICD-10-CM | POA: Insufficient documentation

## 2015-12-19 DIAGNOSIS — E559 Vitamin D deficiency, unspecified: Secondary | ICD-10-CM

## 2015-12-19 DIAGNOSIS — E538 Deficiency of other specified B group vitamins: Secondary | ICD-10-CM

## 2015-12-22 ENCOUNTER — Ambulatory Visit (INDEPENDENT_AMBULATORY_CARE_PROVIDER_SITE_OTHER): Payer: Commercial Managed Care - HMO | Admitting: Family Medicine

## 2015-12-22 ENCOUNTER — Encounter: Payer: Self-pay | Admitting: Family Medicine

## 2015-12-22 VITALS — BP 118/80 | HR 96 | Temp 98.3°F | Resp 18 | Ht 66.0 in | Wt 223.8 lb

## 2015-12-22 DIAGNOSIS — J019 Acute sinusitis, unspecified: Secondary | ICD-10-CM | POA: Diagnosis not present

## 2015-12-22 DIAGNOSIS — J0140 Acute pansinusitis, unspecified: Secondary | ICD-10-CM | POA: Insufficient documentation

## 2015-12-22 NOTE — Progress Notes (Signed)
BP 118/80 mmHg  Pulse 96  Temp(Src) 98.3 F (36.8 C) (Oral)  Resp 18  Ht  (1.676 m)  Wt 223 lb 12 oz (101.492 kg)  BMI 36.13 kg/m2  SpO2 97%   CC: nasal congestion  Subjective:    Patient ID: [name], female    DOB: 1966/05/22, 50 y.o.   MRN: [id number]  HPI: [name] is a 50 y.o. female presenting on 12/22/2015 for Nasal Congestion   4d h/o sinus congestion started with PNDrainage, ST, Tmax 100, maxillary sinus facial pressure, sinus pressure headache, dry hacking cough with PNDrainage.   No ear or tooth pain, abd pain, or wheezing.  Has been treating with ibuprofen and children's dimetapp cold and cough.   H/o SVT. No h/o asthma.  Current smoker 1 ppd.  No sick contacts at home.  Relevant past medical, surgical, family and social history reviewed and updated as indicated. Interim medical history since our last visit reviewed. Allergies and medications reviewed and updated. Current Outpatient Prescriptions on File Prior to Visit  Medication Sig  . diphenhydrAMINE (BENADRYL) 25 MG tablet Take 25 mg by mouth at bedtime as needed for itching.  . hydrochlorothiazide (MICROZIDE) 12.5 MG capsule Take 1 capsule (12.5 mg total) by mouth daily.  . Vitamin D, Ergocalciferol, (DRISDOL) 50000 units CAPS capsule TAKE 1 CAPSULE BY MOUTH ONCE A WEEK.   No current facility-administered medications on file prior to visit.    Review of Systems Per HPI unless specifically indicated in ROS section     Objective:    BP 118/80 mmHg  Pulse 96  Temp(Src) 98.3 F (36.8 C) (Oral)  Resp 18  Ht  (1.676 m)  Wt 223 lb 12 oz (101.492 kg)  BMI 36.13 kg/m2  SpO2 97%  Wt Readings from Last 3 Encounters:  12/22/15 223 lb 12 oz (101.492 kg)  10/09/15 224 lb (101.606 kg)  09/25/15 227 lb 8 oz (103.193 kg)    Physical Exam  Constitutional: She appears well-developed and well-nourished. No distress.  HENT:  Head: Normocephalic and atraumatic.  Right Ear: Hearing,  tympanic membrane, external ear and ear canal normal.  Left Ear: Hearing, tympanic membrane, external ear and ear canal normal.  Nose: Mucosal edema present. No rhinorrhea. Right sinus exhibits maxillary sinus tenderness. Right sinus exhibits no frontal sinus tenderness. Left sinus exhibits maxillary sinus tenderness. Left sinus exhibits no frontal sinus tenderness.  Mouth/Throat: Uvula is midline and mucous membranes are normal. Posterior oropharyngeal erythema present. No oropharyngeal exudate, posterior oropharyngeal edema or tonsillar abscesses.  Nasal mucosal erythema/injection  Eyes: Conjunctivae and EOM are normal. Pupils are equal, round, and reactive to light. No scleral icterus.  Neck: Normal range of motion. Neck supple.  Cardiovascular: Normal rate, regular rhythm, normal heart sounds and intact distal pulses.   No murmur heard. Pulmonary/Chest: Effort normal and breath sounds normal. No respiratory distress. She has no wheezes. She has no rales.  Lymphadenopathy:    She has no cervical adenopathy.  Skin: Skin is warm and dry. No rash noted.  Nursing note and vitals reviewed.      Assessment & Plan:   Problem List Items Addressed This Visit    Acute sinusitis - Primary    Anticipate viral given short duration. Treat supportively with nasal saline, ibuprofen, continue dimetapp at night, add plain mucinex during day, fluids and rest. Pt declines flonase. Signs of bacterial superinfection reviewed with patient - if these develop pt to update [place] for abx course.  Pt agrees with plan.          Follow up plan: Return if symptoms worsen or fail to improve.  Eustaquio BoydenJavier Crysta Gulick, MD

## 2015-12-22 NOTE — Assessment & Plan Note (Signed)
Anticipate viral given short duration. Treat supportively with nasal saline, ibuprofen, continue dimetapp at night, add plain mucinex during day, fluids and rest. Pt declines flonase. Signs of bacterial superinfection reviewed with patient - if these develop pt to update us for abx course. Pt agrees with plan.

## 2015-12-22 NOTE — Progress Notes (Signed)
Pre-visit discussion using our clinic review tool. No additional management support is needed unless otherwise documented below in the visit note.  

## 2015-12-22 NOTE — Patient Instructions (Addendum)
You have a sinus infection, likely viral.  Push fluids and plenty of rest.  Nasal saline irrigation or neti pot to help drain sinuses. Start ibuprofen 400-600mg  twice daily with meals.  May use plain mucinex with plenty of fluid to help mobilize mucous. Take dimetapp at night.  Please let us know if fever >101.5, trouble opening/closing mouth, or worsening productive cough, or just not improving over 7-10 days.

## 2015-12-24 ENCOUNTER — Telehealth: Payer: Self-pay

## 2015-12-24 NOTE — Telephone Encounter (Signed)
PLEASE NOTE: All timestamps contained within this report are represented as [place] Standard Time. CONFIDENTIALTY NOTICE: This fax transmission is intended only for the addressee. It contains information that is legally privileged, confidential or otherwise protected from use or disclosure. If you are not the intended recipient, you are strictly prohibited from reviewing, disclosing, copying using or disseminating any of this information or taking any action in reliance on or regarding this information. If you have received this fax in error, please notify us immediately by telephone so that we can arrange for its return to us. Phone: [phone], Toll-Free: [phone], Fax: [phone] Page: 1 of 2 Call Id: [id number] [facility] Client TELEPHONE ADVICE RECORD [facility] Patient Name: Sonya SallesBARBARA Fahl Gender: Female DOB: 1966-07-06 Age: [age] Y 8 M 20 D Return Phone Number: [phone] (Primary) Address: City/State/Zip: [place] Client [facility] Physician Ruthe MannanAron, Talia - MD Contact Type Call Who Is Calling Patient / Member / Family / Caregiver Call Type Triage / Clinical Relationship To Patient Self Return Phone Number [phone] (Primary) Chief Complaint Nasal Congestion Reason for Call Symptomatic / Request for Health Information Initial Comment caller states she thinks she has a sinus infection PreDisposition Call Doctor Translation No Nurse Assessment Nurse: Sherilyn CooterHenry, RN, Thurmond ButtsWade Date/Time (Eastern Time): 12/22/2015 7:13:00 AM Confirm and document reason for call. If symptomatic, describe symptoms. You must click the next button to save text entered. ---Caller states that she thinks she has a sinus infection. She states she has had a fever on Thursday. She is stopped up. She has post nasal drip at night. She denies pain in the sinus cavity areas,  but she does have some pressure there. The area is not puffy or red in color. Has the patient traveled out of the country within the last 30 days? ---No Does the patient have any new or worsening symptoms? ---Yes Will a triage be completed? ---Yes Related visit to physician within the last 2 weeks? ---No Does the PT have any chronic conditions? (i.e. diabetes, asthma, etc.) ---Yes List chronic conditions. ---HTN Is the patient pregnant or possibly pregnant? (Ask all females between the ages of 6912-55) ---No Is this a behavioral health or substance abuse call? ---No Guidelines Guideline Title Affirmed Question Affirmed Notes Nurse Date/Time Lamount Cohen(Eastern Time) Sinus Pain or Congestion [1] Sinus congestion as part of a cold AND [2] present < 10 days (all triage questions negative) Sherilyn CooterHenry, RN, Thurmond ButtsWade 12/22/2015 7:15:41 AM Disp. Time Lamount Cohen(Eastern Time) Disposition Final User PLEASE NOTE: All timestamps contained within this report are represented as [place] Standard Time. CONFIDENTIALTY NOTICE: This fax transmission is intended only for the addressee. It contains information that is legally privileged, confidential or otherwise protected from use or disclosure. If you are not the intended recipient, you are strictly prohibited from reviewing, disclosing, copying using or disseminating any of this information or taking any action in reliance on or regarding this information. If you have received this fax in error, please notify us immediately by telephone so that we can arrange for its return to us. Phone: [phone], Toll-Free: [phone], Fax: [phone] Page: 2 of 2 Call Id: [id number] 12/22/2015 7:25:42 AM Home Care Yes Sherilyn CooterHenry, RN, Leory PlowmanWade Caller Understands: Yes Disagree/Comply: Disagree Disagree/Comply Reason: Disagree with instructions Care Advice Given Per Guideline HOME CARE: You should be able to treat this at home. * Sinus congestion is a normal part of a cold. * Introduction:  Saline  (salt water) nasal irrigation (nasal wash) is an effective and simple home remedy for treating stuffy nose and sinus congestion. The nose can be irrigated by pouring, spraying, or squirting salt water into the nose and then letting it run back out. * How it Helps: The salt water rinses out excess mucus, washes out any irritants (dust, allergens) that might be present, and moistens the nasal cavity. * If you have a very stuffy nose, nasal decongestant medicines can shrink the swollen nasal mucosa and allow for easier breathing. If you have a very runny nose, these medicines can reduce the amount of drainage. They may be taken as pills by mouth or as a nasal spray. ACETAMINOPHEN (E.G., TYLENOL): IBUPROFEN (E.G., MOTRIN, ADVIL): * Do not take nonsteroidal antiinflammatory drugs (NSAIDs) if you have stomach problems, kidney disease, heart failure, or other contraindications to using this type of medication. * Do not take NSAID medications for over 7 days without consulting your PCP. * GASTROINTESTINAL RISK: There is an increased risk of stomach ulcers, GI bleeding, perforation. * CARDIOVASCULAR RISK: There may be an increased risk of heart attack and stroke. * Sinus congestion from viral upper respiratory infections (colds) usually lasts 5-10 days. * Severe pain persists over 2 hours after pain medicine * Sinus congestion (fullness) persists over 10 days * Fever lasts over 3 days * You become worse. Comments User: Ronney Asters, RN Date/Time Lamount Cohen Time): 12/22/2015 7:26:25 AM Caller wants to be seen today at the [place] [facility]. I checked and we do not have a schedule for them for today. I did provide her with the office phone number.

## 2015-12-24 NOTE — Telephone Encounter (Signed)
[facility]

## 2015-12-28 ENCOUNTER — Other Ambulatory Visit (INDEPENDENT_AMBULATORY_CARE_PROVIDER_SITE_OTHER): Payer: Commercial Managed Care - HMO

## 2015-12-28 DIAGNOSIS — Z0000 Encounter for general adult medical examination without abnormal findings: Secondary | ICD-10-CM

## 2015-12-28 DIAGNOSIS — E538 Deficiency of other specified B group vitamins: Secondary | ICD-10-CM | POA: Diagnosis not present

## 2015-12-28 DIAGNOSIS — E559 Vitamin D deficiency, unspecified: Secondary | ICD-10-CM | POA: Diagnosis not present

## 2015-12-28 DIAGNOSIS — Z01419 Encounter for gynecological examination (general) (routine) without abnormal findings: Secondary | ICD-10-CM

## 2015-12-28 LAB — CBC WITH DIFFERENTIAL/PLATELET
BASOS ABS: 0.1 10*3/uL (ref 0.0–0.1)
Basophils Relative: 0.6 % (ref 0.0–3.0)
Eosinophils Absolute: 0.3 10*3/uL (ref 0.0–0.7)
Eosinophils Relative: 2.7 % (ref 0.0–5.0)
HEMATOCRIT: 42.7 % (ref 36.0–46.0)
HEMOGLOBIN: 14.3 g/dL (ref 12.0–15.0)
LYMPHS PCT: 32.7 % (ref 12.0–46.0)
Lymphs Abs: 3.6 10*3/uL (ref 0.7–4.0)
MCHC: 33.4 g/dL (ref 30.0–36.0)
MCV: 90 fl (ref 78.0–100.0)
Monocytes Absolute: 0.7 10*3/uL (ref 0.1–1.0)
Monocytes Relative: 6.4 % (ref 3.0–12.0)
NEUTROS PCT: 57.6 % (ref 43.0–77.0)
Neutro Abs: 6.3 10*3/uL (ref 1.4–7.7)
PLATELETS: 369 10*3/uL (ref 150.0–400.0)
RBC: 4.75 Mil/uL (ref 3.87–5.11)
RDW: 13 % (ref 11.5–15.5)
WBC: 10.9 10*3/uL — AB (ref 4.0–10.5)

## 2015-12-28 LAB — LIPID PANEL
CHOL/HDL RATIO: 5
Cholesterol: 153 mg/dL (ref 0–200)
HDL: 29.3 mg/dL — AB (ref >39.00)
LDL CALC: 100 mg/dL — AB (ref 0–99)
NONHDL: 123.92
Triglycerides: 119 mg/dL (ref 0.0–149.0)
VLDL: 23.8 mg/dL (ref 0.0–40.0)

## 2015-12-28 LAB — COMPREHENSIVE METABOLIC PANEL
ALT: 9 U/L (ref 0–35)
AST: 15 U/L (ref 0–37)
Albumin: 4.4 g/dL (ref 3.5–5.2)
Alkaline Phosphatase: 59 U/L (ref 39–117)
BILIRUBIN TOTAL: 0.5 mg/dL (ref 0.2–1.2)
BUN: 10 mg/dL (ref 6–23)
CHLORIDE: 105 meq/L (ref 96–112)
CO2: 29 meq/L (ref 19–32)
CREATININE: 0.72 mg/dL (ref 0.40–1.20)
Calcium: 9.6 mg/dL (ref 8.4–10.5)
GFR: 91.61 mL/min (ref >60.00)
GLUCOSE: 93 mg/dL (ref 70–99)
Potassium: 3.7 mEq/L (ref 3.5–5.1)
Sodium: 139 mEq/L (ref 135–145)
Total Protein: 7 g/dL (ref 6.0–8.3)

## 2015-12-28 LAB — TSH: TSH: 0.3 u[IU]/mL — ABNORMAL LOW (ref 0.35–4.50)

## 2015-12-28 LAB — VITAMIN D 25 HYDROXY (VIT D DEFICIENCY, FRACTURES): VITD: 24.43 ng/mL — ABNORMAL LOW (ref 30.00–100.00)

## 2015-12-28 LAB — VITAMIN B12: VITAMIN B 12: 417 pg/mL (ref 211–911)

## 2015-12-30 ENCOUNTER — Other Ambulatory Visit: Payer: Self-pay | Admitting: Family Medicine

## 2016-01-03 ENCOUNTER — Telehealth: Payer: Commercial Managed Care - HMO | Admitting: Physician Assistant

## 2016-01-03 DIAGNOSIS — J329 Chronic sinusitis, unspecified: Secondary | ICD-10-CM

## 2016-01-03 MED ORDER — AMOXICILLIN-POT CLAVULANATE 875-125 MG PO TABS: 1.0000 | ORAL_TABLET | Freq: Two times a day (BID) | ORAL | Status: DC

## 2016-01-03 NOTE — Progress Notes (Signed)
We are sorry that you are not feeling well.  Here is how we plan to help!  Based on what you have shared with me it looks like you have sinusitis.  Sinusitis is inflammation and infection in the sinus cavities of the head.  Based on your presentation I believe you most likely have Acute Bacterial Sinusitis.  This is an infection caused by bacteria and is treated with antibiotics. It seems you still have residual infection. I have prescribed Augmentin 875mg /125mg  one tablet twice daily with food, for 7 days. You may use an oral decongestant such as Mucinex D or if you have glaucoma or high blood pressure use plain Mucinex. Saline nasal spray help and can safely be used as often as needed for congestion.  If you develop worsening sinus pain, fever or notice severe headache and vision changes, or if symptoms are not better after completion of antibiotic, please schedule an appointment with a health care provider.    Sinus infections are not as easily transmitted as other respiratory infection, however we still recommend that you avoid close contact with loved ones, especially the very young and elderly.  Remember to wash your hands thoroughly throughout the day as this is the number one way to prevent the spread of infection!  Home Care:  Only take medications as instructed by your medical team.  Complete the entire course of an antibiotic.  Do not take these medications with alcohol.  A steam or ultrasonic humidifier can help congestion.  You can place a towel over your head and breathe in the steam from hot water coming from a faucet.  Avoid close contacts especially the very young and the elderly.  Cover your mouth when you cough or sneeze.  Always remember to wash your hands.  Get Help Right Away If:  You develop worsening fever or sinus pain.  You develop a severe head ache or visual changes.  Your symptoms persist after you have completed your treatment plan.  Make sure  you  Understand these instructions.  Will watch your condition.  Will get help right away if you are not doing well or get worse.  Your e-visit answers were reviewed by a board certified advanced clinical practitioner to complete your personal care plan.  Depending on the condition, your plan could have included both over the counter or prescription medications.  If there is a problem please reply  once you have received a response from your provider.  Your safety is important to us.  If you have drug allergies check your prescription carefully.    You can use MyChart to ask questions about today's visit, request a non-urgent call back, or ask for a work or school excuse for 24 hours related to this e-Visit. If it has been greater than 24 hours you will need to follow up with your provider, or enter a new e-Visit to address those concerns.  You will get an e-mail in the next two days asking about your experience.  I hope that your e-visit has been valuable and will speed your recovery. Thank you for using e-visits.

## 2016-01-10 ENCOUNTER — Other Ambulatory Visit: Payer: Self-pay | Admitting: Family Medicine

## 2016-01-10 ENCOUNTER — Encounter: Payer: Self-pay | Admitting: Family Medicine

## 2016-01-10 MED ORDER — FLUCONAZOLE 150 MG PO TABS: 150.0000 mg | ORAL_TABLET | Freq: Once | ORAL | Status: DC

## 2016-01-10 MED ORDER — FLUCONAZOLE 150 MG PO TABS: 150.0000 mg | ORAL_TABLET | Freq: Once | ORAL | Status: AC

## 2016-01-10 NOTE — Telephone Encounter (Signed)
Is pt needing to continue high dose VitD? 

## 2016-01-14 ENCOUNTER — Encounter: Payer: Self-pay | Admitting: Family Medicine

## 2016-01-14 ENCOUNTER — Ambulatory Visit (INDEPENDENT_AMBULATORY_CARE_PROVIDER_SITE_OTHER): Payer: Commercial Managed Care - HMO | Admitting: Family Medicine

## 2016-01-14 VITALS — BP 114/72 | HR 96 | Temp 98.2°F | Ht 65.75 in | Wt 218.0 lb

## 2016-01-14 DIAGNOSIS — I10 Essential (primary) hypertension: Secondary | ICD-10-CM | POA: Diagnosis not present

## 2016-01-14 DIAGNOSIS — R7989 Other specified abnormal findings of blood chemistry: Secondary | ICD-10-CM

## 2016-01-14 DIAGNOSIS — E559 Vitamin D deficiency, unspecified: Secondary | ICD-10-CM

## 2016-01-14 DIAGNOSIS — E669 Obesity, unspecified: Secondary | ICD-10-CM | POA: Diagnosis not present

## 2016-01-14 DIAGNOSIS — Z0001 Encounter for general adult medical examination with abnormal findings: Secondary | ICD-10-CM | POA: Diagnosis not present

## 2016-01-14 DIAGNOSIS — R946 Abnormal results of thyroid function studies: Secondary | ICD-10-CM | POA: Diagnosis not present

## 2016-01-14 DIAGNOSIS — Z01419 Encounter for gynecological examination (general) (routine) without abnormal findings: Secondary | ICD-10-CM

## 2016-01-14 MED ORDER — HYDROCHLOROTHIAZIDE 12.5 MG PO CAPS: ORAL_CAPSULE | ORAL | Status: DC

## 2016-01-14 NOTE — Assessment & Plan Note (Signed)
New- ? Thyroiditis. Check full thyroid panel today. The patient indicates understanding of these issues and agrees with the plan.

## 2016-01-14 NOTE — Assessment & Plan Note (Signed)
Reviewed preventive care protocols, scheduled due services, and updated immunizations Discussed nutrition, exercise, diet, and healthy lifestyle.  

## 2016-01-14 NOTE — Progress Notes (Signed)
Pre visit review using our clinic review tool, if applicable. No additional management support is needed unless otherwise documented below in the visit note. 

## 2016-01-14 NOTE — Assessment & Plan Note (Signed)
Deteriorated. Replete with 8 weeks of high dose vitamin D, then resume daily 2000 IU of Vit D. The patient indicates understanding of these issues and agrees with the plan.

## 2016-01-14 NOTE — Patient Instructions (Signed)
Good to see you. I will call you with your results from today.  Happy birthday!

## 2016-01-14 NOTE — Progress Notes (Signed)
Subjective:    Patient ID: [name], female    DOB: 1966/01/19, 50 y.o.   MRN: [id number]  HPI  50 yo pleasant female here for CPX without complaints.  OBGYN- Dr. Marice Potterove. Last pap 05/11/15 Mammogram 12/14/15 Remote h/o ablation  HTN- has been well controlled on HCTZ 12.5 mg daily. Denies any LE edema, HA, blurred vision, CP or SOB. Lab Results  Component Value Date   CREATININE 0.72 12/28/2015   Wt Readings from Last 3 Encounters:  01/14/16 218 lb (98.884 kg)  12/22/15 223 lb 12 oz (101.492 kg)  10/09/15 224 lb (101.606 kg)   Low TSH- has been a little more tired.  Denies any other symptoms of hypo or hyperthyroidism.   Lab Results  Component Value Date   TSH 0.30* 12/28/2015   Lab Results  Component Value Date   ALT 9 12/28/2015   AST 15 12/28/2015   ALKPHOS 59 12/28/2015   BILITOT 0.5 12/28/2015   Lab Results  Component Value Date   NA 139 12/28/2015   K 3.7 12/28/2015   CL 105 12/28/2015   CO2 29 12/28/2015   Lab Results  Component Value Date   CHOL 153 12/28/2015   HDL 29.30* 12/28/2015   LDLCALC 100* 12/28/2015   TRIG 119.0 12/28/2015   CHOLHDL 5 12/28/2015    Vit B12 deficiency-  Has been receiving monthly IM b12 injections here.   Lab Results  Component Value Date   VITAMINB12 417 12/28/2015   Vit D deficiency-  Stopped taking Vit D.  Low again this month.  Patient Active Problem List   Diagnosis Date Noted  . Low TSH level 01/14/2016  . Well woman exam 12/19/2015  . Eye redness 04/03/2015  . Other malaise and fatigue 12/21/2013  . Other and unspecified ovarian cyst 10/28/2013  . Menopause 10/28/2013  . Adjustment disorder with anxious mood 12/23/2010  . Essential hypertension 03/04/2010  . DEPRESSION, SITUATIONAL 05/15/2009  . Vitamin D deficiency 09/25/2008  . Obesity 09/25/2008  . Iron deficiency anemia 09/25/2008  . TOBACCO ABUSE 08/25/2007   Past Medical History  Diagnosis Date  . Anemia   . SVT (supraventricular  tachycardia) (HCC)   . Smoker   . Obesity   . Stress fracture   . Vitamin D deficiency   . Arthritis   . HTN (hypertension)   . SVT (supraventricular tachycardia) (HCC)   . Arthritis   . Abnormal uterine bleeding   . Obesity    Past Surgical History  Procedure Laterality Date  . Cesarean section      x 2  . Salpingectomy      right; adhesions  . Ovarian cyst surgery      left  . Tubal ligation    . Knee surgery      right x 2  . Appendectomy    . Hydrothermal ablation      uteral and cyst removal  . Hysteroscopy    . Left oophorectomy    . Dermoid cyst  excision     Social History  Substance Use Topics  . Smoking status: Current Every Day Smoker -- 1.00 packs/day for 25 years    Types: Cigarettes  . Smokeless tobacco: Never Used  . Alcohol Use: No   Family History  Problem Relation Age of Onset  . Diabetes Brother   . Seizures Brother   . Colon cancer Neg Hx   . Cancer Maternal Grandmother      gallbladder  . Stroke Paternal Grandfather   .  Stroke Brother   . Colon polyps Mother   . Heart disease Father   . Diabetes Father    Allergies  Allergen Reactions  . Codeine   . Latex     Current outpatient prescriptions:  .  diphenhydrAMINE (BENADRYL) 25 MG tablet, Take 25 mg by mouth at bedtime as needed for itching., Disp: , Rfl:  .  hydrochlorothiazide (MICROZIDE) 12.5 MG capsule, TAKE 1 CAPSULE (12.5 MG TOTAL) BY MOUTH DAILY., Disp: 90 capsule, Rfl: 3 .  Vitamin D, Ergocalciferol, (DRISDOL) 50000 units CAPS capsule, TAKE 1 CAPSULE BY MOUTH ONCE A WEEK., Disp: 6 capsule, Rfl: 0   The PMH, PSH, Social History, Family History, Medications, and allergies have been reviewed in [facility], and have been updated if relevant.     Review of Systems  Constitutional: Positive for fatigue.  HENT: Negative.   Respiratory: Negative.   Cardiovascular: Negative.   Gastrointestinal: Negative.   Endocrine: Negative.   Genitourinary: Negative.   Musculoskeletal: Negative  for myalgias.  Skin: Negative.   Allergic/Immunologic: Negative.   Neurological: Negative.   Hematological: Negative.   Psychiatric/Behavioral: Negative.   All other systems reviewed and are negative.  See HPI     Objective:   Physical Exam BP 114/72 mmHg  Pulse 96  Temp(Src) 98.2 F (36.8 C) (Oral)  Ht 5' 5.75" (1.67 m)  Wt 218 lb (98.884 kg)  BMI 35.46 kg/m2  SpO2 96%  General:  Obese,  Well-developed,well-nourished,in no acute distress; alert,appropriate and cooperative throughout examination Head:  normocephalic and atraumatic.   Eyes:  vision grossly intact, pupils equal, pupils round, and pupils reactive to light.   Ears:  R ear normal and L ear normal.   Nose:  no external deformity.   Mouth:  good dentition.    Heart:  Normal rate and regular rhythm. S1 and S2 normal without gallop, murmur, click, rub or other extra sounds. Abdomen:  Bowel sounds positive,abdomen soft and non-tender without masses, organomegaly or hernias noted. Msk:  No deformity or scoliosis noted of thoracic or lumbar spine.   Extremities:  No clubbing, cyanosis, edema, or deformity noted with normal full range of motion of all joints.   Neurologic:  alert & oriented X3 and gait normal.   Skin:  Intact without suspicious lesions or rashes Cervical Nodes:  No lymphadenopathy noted Axillary Nodes:  No palpable lymphadenopathy Psych:  Cognition and judgment appear intact. Alert and cooperative with normal attention span and concentration. No apparent delusions, illusions, hallucinations     Assessment & Plan:

## 2016-01-14 NOTE — Assessment & Plan Note (Signed)
Stable on low dose HCTZ. No changes made to rx today.

## 2016-01-14 NOTE — Addendum Note (Signed)
Addended by: Baldomero LamyHAVERS, Dionisio Aragones C on: 01/14/2016 03:59 PM   Modules accepted: Kipp BroodSmartSet

## 2016-01-14 NOTE — Addendum Note (Signed)
Addended by: Dianne DunARON, TALIA M on: 01/14/2016 03:33 PM   Modules accepted: Kipp BroodSmartSet

## 2016-01-15 LAB — TSH: TSH: 0.86 u[IU]/mL (ref 0.35–4.50)

## 2016-01-15 LAB — T4, FREE: Free T4: 0.79 ng/dL (ref 0.60–1.60)

## 2016-01-15 LAB — T3, FREE: T3 FREE: 3.9 pg/mL (ref 2.3–4.2)

## 2016-02-18 ENCOUNTER — Other Ambulatory Visit: Payer: Self-pay | Admitting: Family Medicine

## 2016-02-18 NOTE — Telephone Encounter (Signed)
Is pt to continue taking high dose vitD?

## 2016-03-21 ENCOUNTER — Other Ambulatory Visit: Payer: Self-pay | Admitting: Family Medicine

## 2016-05-02 ENCOUNTER — Other Ambulatory Visit: Payer: Self-pay | Admitting: Family Medicine

## 2016-05-02 NOTE — Telephone Encounter (Signed)
Is pt needing to continue high dose vitD

## 2016-06-23 ENCOUNTER — Ambulatory Visit (INDEPENDENT_AMBULATORY_CARE_PROVIDER_SITE_OTHER): Payer: Commercial Managed Care - HMO | Admitting: Family Medicine

## 2016-06-23 ENCOUNTER — Encounter: Payer: Self-pay | Admitting: Family Medicine

## 2016-06-23 VITALS — BP 136/82 | HR 95 | Temp 99.0°F | Wt 212.5 lb

## 2016-06-23 DIAGNOSIS — J069 Acute upper respiratory infection, unspecified: Secondary | ICD-10-CM | POA: Diagnosis not present

## 2016-06-23 MED ORDER — AZITHROMYCIN 250 MG PO TABS: ORAL_TABLET | ORAL | 0 refills | Status: DC

## 2016-06-23 NOTE — Progress Notes (Signed)
SUBJECTIVE:  [name] is a 50 y.o. female who complains of congestion, sore throat, dry cough, bilateral sinus pain and fever for 3 days. She denies a history of anorexia and chest pain and denies a history of asthma. Patient denies smoke cigarettes.   Current Outpatient Prescriptions on File Prior to Visit  Medication Sig Dispense Refill  . diphenhydrAMINE (BENADRYL) 25 MG tablet Take 25 mg by mouth at bedtime as needed for itching.    . hydrochlorothiazide (MICROZIDE) 12.5 MG capsule TAKE 1 CAPSULE (12.5 MG TOTAL) BY MOUTH DAILY. 90 capsule 3  . Vitamin D, Ergocalciferol, (DRISDOL) 50000 units CAPS capsule TAKE ONE CAPSULE BY MOUTH ONCE A WEEK 6 capsule 0   No current facility-administered medications on file prior to visit.     Allergies  Allergen Reactions  . Codeine   . Latex     Past Medical History:  Diagnosis Date  . Abnormal uterine bleeding   . Anemia   . Arthritis   . Arthritis   . HTN (hypertension)   . Obesity   . Obesity   . Smoker   . Stress fracture   . SVT (supraventricular tachycardia) (HCC)   . SVT (supraventricular tachycardia) (HCC)   . Vitamin D deficiency     Past Surgical History:  Procedure Laterality Date  . APPENDECTOMY    . CESAREAN SECTION     x 2  . DERMOID CYST  EXCISION    . hydrothermal ablation     uteral and cyst removal  . HYSTEROSCOPY    . KNEE SURGERY     right x 2  . LEFT OOPHORECTOMY    . OVARIAN CYST SURGERY     left  . SALPINGECTOMY     right; adhesions  . TUBAL LIGATION      Family History  Problem Relation Age of Onset  . Diabetes Brother   . Seizures Brother   . Colon cancer Neg Hx   . Cancer Maternal Grandmother      gallbladder  . Stroke Paternal Grandfather   . Stroke Brother   . Colon polyps Mother   . Heart disease Father   . Diabetes Father     Social History   Social History  . Marital status: Married    Spouse name: N/A  . Number of children: 2  . Years of education: N/A    Occupational History  . 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 Smoking status: Current Every Day Smoker    Packs/day: 1.00    Years: 25.00    Types: Cigarettes  . Smokeless tobacco: Never Used  . Alcohol use No  . Drug use: No  . Sexual activity: Yes    Partners: Male    Birth control/ protection: Surgical   Other Topics Concern  . Not on file   Social History Narrative  . No narrative on file   The PMH, PSH, Social History, Family History, Medications, and allergies [facility], and have been updated if relevant.  OBJECTIVE: BP 136/82   Pulse 95   Temp 99 F (37.2 C) (Oral)   Wt 212 lb 8 oz (96.4 kg)   SpO2 97%   BMI 34.56 kg/m   She appears well, vital signs are as noted. Ears normal.  Throat and pharynx normal.  Neck supple. No adenopathy in the neck. Nose is congested. Sinuses tender. The chest is clear, without wheezes or rales.  ASSESSMENT:  viral upper respiratory illness  PLAN: Symptomatic therapy suggested: push fluids, rest and return office visit prn if symptoms persist or worsen. Lack of antibiotic effectiveness discussed with her  But rx printed for zpack that she can fill if symptoms persist.  Call or return to clinic prn if these symptoms worsen or fail to improve as anticipated.

## 2016-06-23 NOTE — Progress Notes (Signed)
Pre visit review using our clinic review tool, if applicable. No additional management support is needed unless otherwise documented below in the visit note. 

## 2016-09-04 ENCOUNTER — Encounter: Payer: Self-pay | Admitting: *Deleted

## 2016-09-04 ENCOUNTER — Ambulatory Visit (INDEPENDENT_AMBULATORY_CARE_PROVIDER_SITE_OTHER): Payer: Commercial Managed Care - HMO | Admitting: Family Medicine

## 2016-09-04 ENCOUNTER — Encounter: Payer: Self-pay | Admitting: Family Medicine

## 2016-09-04 ENCOUNTER — Ambulatory Visit
Admission: RE | Admit: 2016-09-04 | Discharge: 2016-09-04 | Disposition: A | Discharge status: Home / Self Care | Payer: Commercial Managed Care - HMO | Source: Ambulatory Visit | Attending: Family Medicine | Admitting: Family Medicine

## 2016-09-04 VITALS — BP 128/74 | HR 74 | Temp 97.7°F | Wt 210.5 lb

## 2016-09-04 DIAGNOSIS — R1011 Right upper quadrant pain: Secondary | ICD-10-CM

## 2016-09-04 DIAGNOSIS — R350 Frequency of micturition: Secondary | ICD-10-CM | POA: Diagnosis not present

## 2016-09-04 LAB — CBC WITH DIFFERENTIAL/PLATELET
BASOS PCT: 0.8 % (ref 0.0–3.0)
Basophils Absolute: 0.1 10*3/uL (ref 0.0–0.1)
EOS ABS: 0.3 10*3/uL (ref 0.0–0.7)
Eosinophils Relative: 3.2 % (ref 0.0–5.0)
HEMATOCRIT: 44.2 % (ref 36.0–46.0)
HEMOGLOBIN: 15.1 g/dL — AB (ref 12.0–15.0)
Lymphocytes Relative: 39.2 % (ref 12.0–46.0)
Lymphs Abs: 3.9 10*3/uL (ref 0.7–4.0)
MCHC: 34.2 g/dL (ref 30.0–36.0)
MCV: 90.1 fl (ref 78.0–100.0)
MONO ABS: 0.6 10*3/uL (ref 0.1–1.0)
Monocytes Relative: 6.5 % (ref 3.0–12.0)
NEUTROS ABS: 5 10*3/uL (ref 1.4–7.7)
Neutrophils Relative %: 50.3 % (ref 43.0–77.0)
PLATELETS: 347 10*3/uL (ref 150.0–400.0)
RBC: 4.9 Mil/uL (ref 3.87–5.11)
RDW: 12.5 % (ref 11.5–15.5)
WBC: 9.9 10*3/uL (ref 4.0–10.5)

## 2016-09-04 LAB — POC URINALSYSI DIPSTICK (AUTOMATED)
BILIRUBIN UA: NEGATIVE
Blood, UA: NEGATIVE
Glucose, UA: NEGATIVE
KETONES UA: NEGATIVE
Leukocytes, UA: NEGATIVE
Nitrite, UA: NEGATIVE
PROTEIN UA: NEGATIVE
Urobilinogen, UA: 0.2
pH, UA: 6

## 2016-09-04 LAB — COMPREHENSIVE METABOLIC PANEL
ALBUMIN: 4.5 g/dL (ref 3.5–5.2)
ALT: 8 U/L (ref 0–35)
AST: 15 U/L (ref 0–37)
Alkaline Phosphatase: 64 U/L (ref 39–117)
BUN: 11 mg/dL (ref 6–23)
CHLORIDE: 107 meq/L (ref 96–112)
CO2: 30 meq/L (ref 19–32)
CREATININE: 0.75 mg/dL (ref 0.40–1.20)
Calcium: 9.8 mg/dL (ref 8.4–10.5)
GFR: 87.14 mL/min (ref >60.00)
Glucose, Bld: 97 mg/dL (ref 70–99)
POTASSIUM: 4 meq/L (ref 3.5–5.1)
SODIUM: 140 meq/L (ref 135–145)
Total Bilirubin: 0.4 mg/dL (ref 0.2–1.2)
Total Protein: 7.3 g/dL (ref 6.0–8.3)

## 2016-09-04 LAB — LIPASE: LIPASE: 15 U/L (ref 11.0–59.0)

## 2016-09-04 NOTE — Patient Instructions (Signed)
Good to see you. After you go to the lab, please stop by to see [name] on your way out. 

## 2016-09-04 NOTE — Progress Notes (Signed)
Pre visit review using our clinic review tool, if applicable. No additional management support is needed unless otherwise documented below in the visit note. 

## 2016-09-04 NOTE — Assessment & Plan Note (Signed)
With some tenderness in RUQ on exam. US to rule out biliary stones/colic and labs as part of initial work up. Afebrile and nontoxic appearing today. Orders Placed This Encounter  Procedures  . US Abdomen Limited RUQ  . Lipase  . Comprehensive metabolic panel  . CBC with Differential/Platelet  . POCT Urinalysis Dipstick (Automated)

## 2016-09-04 NOTE — Progress Notes (Signed)
Subjective:   Patient ID: [name], female    DOB: 12-23-1965, 51 y.o.   MRN: [id number]  [name] is a pleasant 51 y.o. year old female who presents to clinic today with Nausea and Urinary Frequency  on 09/04/2016  HPI:  Several weeks of intermittent post prandial RUQ pain and nausea, Upon questioning she is not sure if maybe she has had some urinary frequency as well.  No vomiting.  Not sure if anything makes it better or worse.  No fever.   Current Outpatient Prescriptions on File Prior to Visit  Medication Sig Dispense Refill  . diphenhydrAMINE (BENADRYL) 25 MG tablet Take 25 mg by mouth at bedtime as needed for itching.    . hydrochlorothiazide (MICROZIDE) 12.5 MG capsule TAKE 1 CAPSULE (12.5 MG TOTAL) BY MOUTH DAILY. 90 capsule 3  . Vitamin D, Ergocalciferol, (DRISDOL) 50000 units CAPS capsule TAKE ONE CAPSULE BY MOUTH ONCE A WEEK 6 capsule 0   No current facility-administered medications on file prior to visit.     Allergies  Allergen Reactions  . Codeine   . Latex     Past Medical History:  Diagnosis Date  . Abnormal uterine bleeding   . Anemia   . Arthritis   . Arthritis   . HTN (hypertension)   . Obesity   . Obesity   . Smoker   . Stress fracture   . SVT (supraventricular tachycardia) (HCC)   . SVT (supraventricular tachycardia) (HCC)   . Vitamin D deficiency     Past Surgical History:  Procedure Laterality Date  . APPENDECTOMY    . CESAREAN SECTION     x 2  . DERMOID CYST  EXCISION    . hydrothermal ablation     uteral and cyst removal  . HYSTEROSCOPY    . KNEE SURGERY     right x 2  . LEFT OOPHORECTOMY    . OVARIAN CYST SURGERY     left  . SALPINGECTOMY     right; adhesions  . TUBAL LIGATION      Family History  Problem Relation Age of Onset  . Diabetes Brother   . Seizures Brother   . Colon cancer Neg Hx   . Cancer Maternal Grandmother      gallbladder  . Stroke Paternal Grandfather   . Stroke Brother   . Colon  polyps Mother   . Heart disease Father   . Diabetes Father     Social History   Social History  . Marital status: Married    Spouse name: N/A  . Number of children: 2  . Years of education: N/A   Occupational History  . 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 Smoking status: Current Every Day Smoker    Packs/day: 1.00    Years: 25.00    Types: Cigarettes  . Smokeless tobacco: Never Used  . Alcohol use No  . Drug use: No  . Sexual activity: Yes    Partners: Male    Birth control/ protection: Surgical   Other Topics Concern  . Not on file   Social History Narrative  . No narrative on file   The PMH, PSH, Social History, Family History, Medications, and allergies have been reviewed in [facility], and have been updated if relevant.   Review of Systems  Constitutional: Negative for fever.  Gastrointestinal: Positive for abdominal pain and nausea. Negative for blood in stool, constipation, diarrhea, rectal pain and vomiting.  Genitourinary: Positive for frequency. Negative for difficulty urinating, dysuria and flank pain.  All other systems reviewed and are negative.      Objective:    BP 128/74   Pulse 74   Temp 97.7 F (36.5 C) (Oral)   Wt 210 lb 8 oz (95.5 kg)   SpO2 98%   BMI 34.23 kg/m    Physical Exam  Constitutional: She is oriented to person, place, and time. She appears well-developed and well-nourished. No distress.  HENT:  Head: Normocephalic.  Eyes: Conjunctivae are normal.  Pulmonary/Chest: Effort normal.  Abdominal: Soft. Bowel sounds are normal. She exhibits no distension and no mass. There is tenderness. There is no rebound and no guarding.  Musculoskeletal: Normal range of motion.  Neurological: She is alert and oriented to person, place, and time. No cranial nerve deficit.  Skin: Skin is dry. She is not diaphoretic.  Psychiatric: She has a normal mood and affect. Her behavior is normal. Judgment and thought content normal.    Nursing note and vitals reviewed.         Assessment & Plan:   Abdominal pain, right upper quadrant - Plan: US Abdomen Limited RUQ, Lipase, Comprehensive metabolic panel, CBC with Differential/Platelet  Urinary frequency - Plan: POCT Urinalysis Dipstick (Automated) No Follow-up on file.

## 2016-09-04 NOTE — Progress Notes (Signed)
Subjective:   Patient ID: [name], female    DOB: Apr 13, 1966, 51 y.o.   MRN: [id number]  [name] is a pleasant 51 y.o. year old female who presents to clinic today with Nausea and Urinary Frequency  on 09/04/2016  HPI:     Current Outpatient Prescriptions on File Prior to Visit  Medication Sig Dispense Refill  . diphenhydrAMINE (BENADRYL) 25 MG tablet Take 25 mg by mouth at bedtime as needed for itching.    . hydrochlorothiazide (MICROZIDE) 12.5 MG capsule TAKE 1 CAPSULE (12.5 MG TOTAL) BY MOUTH DAILY. 90 capsule 3  . Vitamin D, Ergocalciferol, (DRISDOL) 50000 units CAPS capsule TAKE ONE CAPSULE BY MOUTH ONCE A WEEK 6 capsule 0   No current facility-administered medications on file prior to visit.     Allergies  Allergen Reactions  . Codeine   . Latex     Past Medical History:  Diagnosis Date  . Abnormal uterine bleeding   . Anemia   . Arthritis   . Arthritis   . HTN (hypertension)   . Obesity   . Obesity   . Smoker   . Stress fracture   . SVT (supraventricular tachycardia) (HCC)   . SVT (supraventricular tachycardia) (HCC)   . Vitamin D deficiency     Past Surgical History:  Procedure Laterality Date  . APPENDECTOMY    . CESAREAN SECTION     x 2  . DERMOID CYST  EXCISION    . hydrothermal ablation     uteral and cyst removal  . HYSTEROSCOPY    . KNEE SURGERY     right x 2  . LEFT OOPHORECTOMY    . OVARIAN CYST SURGERY     left  . SALPINGECTOMY     right; adhesions  . TUBAL LIGATION      Family History  Problem Relation Age of Onset  . Diabetes Brother   . Seizures Brother   . Colon cancer Neg Hx   . Cancer Maternal Grandmother      gallbladder  . Stroke Paternal Grandfather   . Stroke Brother   . Colon polyps Mother   . Heart disease Father   . Diabetes Father     Social History   Social History  . Marital status: Married    Spouse name: N/A  . Number of children: 2  . Years of education: N/A   Occupational History    . 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 Smoking status: Current Every Day Smoker    Packs/day: 1.00    Years: 25.00    Types: Cigarettes  . Smokeless tobacco: Never Used  . Alcohol use No  . Drug use: No  . Sexual activity: Yes    Partners: Male    Birth control/ protection: Surgical   Other Topics Concern  . Not on file   Social History Narrative  . No narrative on file   The PMH, PSH, Social History, Family History, Medications, and allergies have been reviewed in [facility], and have been updated if relevant.   Review of Systems     Objective:    BP 128/74   Pulse 74   Temp 97.7 F (36.5 C) (Oral)   Wt 210 lb 8 oz (95.5 kg)   SpO2 98%   BMI 34.23 kg/m    Physical Exam        Assessment & Plan:   Urinary frequency - Plan: POCT Urinalysis Dipstick (  Automated) No Follow-up on file.

## 2016-09-11 ENCOUNTER — Encounter: Payer: Self-pay | Admitting: Family Medicine

## 2016-09-12 ENCOUNTER — Other Ambulatory Visit: Payer: Self-pay | Admitting: Family Medicine

## 2016-09-12 MED ORDER — OSELTAMIVIR PHOSPHATE 75 MG PO CAPS: 75.0000 mg | ORAL_CAPSULE | Freq: Every day | ORAL | 0 refills | Status: DC

## 2016-11-18 ENCOUNTER — Encounter: Payer: Self-pay | Admitting: Family Medicine

## 2016-11-18 MED ORDER — VARENICLINE TARTRATE 0.5 MG X 11 & 1 MG X 42 PO MISC: ORAL | 0 refills | Status: DC

## 2016-11-18 NOTE — Telephone Encounter (Signed)
Chantix rx printed.

## 2016-11-18 NOTE — Telephone Encounter (Signed)
Called patient informed RX ready for pick-up

## 2016-12-13 ENCOUNTER — Other Ambulatory Visit: Payer: Self-pay | Admitting: Family Medicine

## 2016-12-16 DIAGNOSIS — Z01419 Encounter for gynecological examination (general) (routine) without abnormal findings: Secondary | ICD-10-CM | POA: Diagnosis not present

## 2017-01-01 ENCOUNTER — Other Ambulatory Visit: Payer: Self-pay | Admitting: Family Medicine

## 2017-01-01 DIAGNOSIS — Z01419 Encounter for gynecological examination (general) (routine) without abnormal findings: Secondary | ICD-10-CM

## 2017-01-01 DIAGNOSIS — E559 Vitamin D deficiency, unspecified: Secondary | ICD-10-CM

## 2017-01-04 DIAGNOSIS — S0501XA Injury of conjunctiva and corneal abrasion without foreign body, right eye, initial encounter: Secondary | ICD-10-CM | POA: Diagnosis not present

## 2017-01-06 ENCOUNTER — Other Ambulatory Visit: Payer: Self-pay | Admitting: Family Medicine

## 2017-01-12 ENCOUNTER — Other Ambulatory Visit (INDEPENDENT_AMBULATORY_CARE_PROVIDER_SITE_OTHER): Payer: Commercial Managed Care - HMO

## 2017-01-12 DIAGNOSIS — Z01419 Encounter for gynecological examination (general) (routine) without abnormal findings: Secondary | ICD-10-CM | POA: Diagnosis not present

## 2017-01-12 DIAGNOSIS — E559 Vitamin D deficiency, unspecified: Secondary | ICD-10-CM

## 2017-01-12 LAB — CBC WITH DIFFERENTIAL/PLATELET
BASOS ABS: 0.1 10*3/uL (ref 0.0–0.1)
Basophils Relative: 1.1 % (ref 0.0–3.0)
EOS PCT: 3.2 % (ref 0.0–5.0)
Eosinophils Absolute: 0.3 10*3/uL (ref 0.0–0.7)
HEMATOCRIT: 42.4 % (ref 36.0–46.0)
Hemoglobin: 14.4 g/dL (ref 12.0–15.0)
LYMPHS ABS: 3.5 10*3/uL (ref 0.7–4.0)
LYMPHS PCT: 37.6 % (ref 12.0–46.0)
MCHC: 34 g/dL (ref 30.0–36.0)
MCV: 90.2 fl (ref 78.0–100.0)
MONOS PCT: 6.3 % (ref 3.0–12.0)
Monocytes Absolute: 0.6 10*3/uL (ref 0.1–1.0)
NEUTROS ABS: 4.9 10*3/uL (ref 1.4–7.7)
NEUTROS PCT: 51.8 % (ref 43.0–77.0)
Platelets: 332 10*3/uL (ref 150.0–400.0)
RBC: 4.7 Mil/uL (ref 3.87–5.11)
RDW: 13 % (ref 11.5–15.5)
WBC: 9.4 10*3/uL (ref 4.0–10.5)

## 2017-01-12 LAB — COMPREHENSIVE METABOLIC PANEL
ALT: 7 U/L (ref 0–35)
AST: 16 U/L (ref 0–37)
Albumin: 4.3 g/dL (ref 3.5–5.2)
Alkaline Phosphatase: 59 U/L (ref 39–117)
BILIRUBIN TOTAL: 0.5 mg/dL (ref 0.2–1.2)
BUN: 10 mg/dL (ref 6–23)
CO2: 29 mEq/L (ref 19–32)
Calcium: 9.7 mg/dL (ref 8.4–10.5)
Chloride: 105 mEq/L (ref 96–112)
Creatinine, Ser: 0.82 mg/dL (ref 0.40–1.20)
GFR: 78.5 mL/min (ref >60.00)
GLUCOSE: 91 mg/dL (ref 70–99)
POTASSIUM: 4.1 meq/L (ref 3.5–5.1)
Sodium: 139 mEq/L (ref 135–145)
Total Protein: 6.9 g/dL (ref 6.0–8.3)

## 2017-01-12 LAB — VITAMIN D 25 HYDROXY (VIT D DEFICIENCY, FRACTURES): VITD: 42.97 ng/mL (ref 30.00–100.00)

## 2017-01-12 LAB — LIPID PANEL
Cholesterol: 184 mg/dL (ref 0–200)
HDL: 37.8 mg/dL — AB (ref >39.00)
LDL Cholesterol: 121 mg/dL — ABNORMAL HIGH (ref 0–99)
NonHDL: 145.87
TRIGLYCERIDES: 123 mg/dL (ref 0.0–149.0)
Total CHOL/HDL Ratio: 5
VLDL: 24.6 mg/dL (ref 0.0–40.0)

## 2017-01-12 LAB — TSH: TSH: 0.96 u[IU]/mL (ref 0.35–4.50)

## 2017-01-14 ENCOUNTER — Ambulatory Visit (INDEPENDENT_AMBULATORY_CARE_PROVIDER_SITE_OTHER): Payer: Commercial Managed Care - HMO | Admitting: Family Medicine

## 2017-01-14 ENCOUNTER — Encounter: Payer: Self-pay | Admitting: Family Medicine

## 2017-01-14 VITALS — BP 130/90 | HR 72 | Ht 66.25 in | Wt 205.0 lb

## 2017-01-14 DIAGNOSIS — I10 Essential (primary) hypertension: Secondary | ICD-10-CM

## 2017-01-14 DIAGNOSIS — Z01419 Encounter for gynecological examination (general) (routine) without abnormal findings: Secondary | ICD-10-CM | POA: Diagnosis not present

## 2017-01-14 DIAGNOSIS — E559 Vitamin D deficiency, unspecified: Secondary | ICD-10-CM | POA: Diagnosis not present

## 2017-01-14 MED ORDER — HYDROCHLOROTHIAZIDE 12.5 MG PO CAPS: 12.5000 mg | ORAL_CAPSULE | Freq: Every day | ORAL | 5 refills | Status: DC

## 2017-01-14 MED ORDER — VITAMIN D (ERGOCALCIFEROL) 1.25 MG (50000 UNIT) PO CAPS: 50000.0000 [IU] | ORAL_CAPSULE | ORAL | 0 refills | Status: DC

## 2017-01-14 NOTE — Progress Notes (Signed)
Subjective:    Patient ID: [name], female    DOB: 1966-08-10, 51 y.o.   MRN: [id number]  HPI  51 yo pleasant female here for CPX without complaints.  OBGYN- Dr. Marice Potter. Last pap 12/2016 Mammogram 12/2016 Remote h/o ablation  HTN- has been well controlled on HCTZ 12.5 mg daily. Denies any LE edema, HA, blurred vision, CP or SOB. Lab Results  Component Value Date   CREATININE 0.82 01/12/2017   Wt Readings from Last 3 Encounters:  01/14/17 205 lb (93 kg)  09/04/16 210 lb 8 oz (95.5 kg)  06/23/16 212 lb 8 oz (96.4 kg)   Lab Results  Component Value Date   TSH 0.96 01/12/2017   Lab Results  Component Value Date   ALT 7 01/12/2017   AST 16 01/12/2017   ALKPHOS 59 01/12/2017   BILITOT 0.5 01/12/2017   Lab Results  Component Value Date   NA 139 01/12/2017   K 4.1 01/12/2017   CL 105 01/12/2017   CO2 29 01/12/2017   Lab Results  Component Value Date   CHOL 184 01/12/2017   HDL 37.80 (L) 01/12/2017   LDLCALC 121 (H) 01/12/2017   TRIG 123.0 01/12/2017   CHOLHDL 5 01/12/2017     Lab Results  Component Value Date   VITAMINB12 417 12/28/2015    Patient Active Problem List   Diagnosis Date Noted  . Well woman exam 01/01/2017  . Other and unspecified ovarian cyst 10/28/2013  . Menopause 10/28/2013  . Adjustment disorder with anxious mood 12/23/2010  . Essential hypertension 03/04/2010  . DEPRESSION, SITUATIONAL 05/15/2009  . Vitamin D deficiency 09/25/2008  . Obesity 09/25/2008  . Iron deficiency anemia 09/25/2008  . TOBACCO ABUSE 08/25/2007   Past Medical History:  Diagnosis Date  . Abnormal uterine bleeding   . Anemia   . Arthritis   . Arthritis   . HTN (hypertension)   . Obesity   . Obesity   . Smoker   . Stress fracture   . SVT (supraventricular tachycardia) (HCC)   . SVT (supraventricular tachycardia) (HCC)   . Vitamin D deficiency    Past Surgical History:  Procedure Laterality Date  . APPENDECTOMY    . CESAREAN SECTION     x 2  .  DERMOID CYST  EXCISION    . hydrothermal ablation     uteral and cyst removal  . HYSTEROSCOPY    . KNEE SURGERY     right x 2  . LEFT OOPHORECTOMY    . OVARIAN CYST SURGERY     left  . SALPINGECTOMY     right; adhesions  . TUBAL LIGATION     Social History  Substance Use Topics  . Smoking status: Current Every Day Smoker    Packs/day: 1.00    Years: 25.00    Types: Cigarettes  . Smokeless tobacco: Never Used  . Alcohol use No   Family History  Problem Relation Age of Onset  . Diabetes Brother   . Seizures Brother   . Colon cancer Neg Hx   . Cancer Maternal Grandmother         gallbladder  . Stroke Paternal Grandfather   . Stroke Brother   . Colon polyps Mother   . Heart disease Father   . Diabetes Father    Allergies  Allergen Reactions  . Codeine   . Latex     Current Outpatient Prescriptions:  .  diphenhydrAMINE (BENADRYL) 25 MG tablet, Take 25 mg by mouth at  bedtime as needed for itching., Disp: , Rfl:  .  hydrochlorothiazide (MICROZIDE) 12.5 MG capsule, TAKE ONE CAPSULE BY MOUTH EVERY DAY, Disp: 30 capsule, Rfl: 0 .  varenicline (CHANTIX STARTING MONTH PAK) 0.5 MG X 11 & 1 MG X 42 tablet, Take one 0.5 mg tablet by mouth once daily for 3 days, then increase to one 0.5 mg tablet twice daily for 4 days, then increase to one 1 mg tablet twice daily., Disp: 53 tablet, Rfl: 0 .  Vitamin D, Ergocalciferol, (DRISDOL) 50000 units CAPS capsule, TAKE ONE CAPSULE BY MOUTH ONCE WEEKLY, Disp: 6 capsule, Rfl: 0   The PMH, PSH, Social History, Family History, Medications, and [facility], and have been updated if relevant.     Review of Systems  Constitutional: Positive for fatigue.  HENT: Negative.   Respiratory: Negative.   Cardiovascular: Negative.   Gastrointestinal: Negative.   Endocrine: Negative.   Genitourinary: Negative.   Musculoskeletal: Negative for myalgias.  Skin: Negative.   Allergic/Immunologic: Negative.   Neurological:  Negative.   Hematological: Negative.   Psychiatric/Behavioral: Negative.   All other systems reviewed and are negative.     Objective:   Physical Exam BP 130/90   Pulse 72   Ht 5' 6.25" (1.683 m)   Wt 205 lb (93 kg)   SpO2 98%   BMI 32.84 kg/m    General:  Well-developed,well-nourished,in no acute distress; alert,appropriate and cooperative throughout examination Head:  normocephalic and atraumatic.   Eyes:  vision grossly intact, PERRL Ears:  R ear normal and L ear normal externally, TMs clear bilaterally Nose:  no external deformity.   Mouth:  good dentition.   Neck:  No deformities, masses, or tenderness noted. Breasts:  No mass, nodules, thickening, tenderness, bulging, retraction, inflamation, nipple discharge or skin changes noted.   Lungs:  Normal respiratory effort, chest expands symmetrically. Lungs are clear to auscultation, no crackles or wheezes. Heart:  Normal rate and regular rhythm. S1 and S2 normal without gallop, murmur, click, rub or other extra sounds. Abdomen:  Bowel sounds positive,abdomen soft and non-tender without masses, organomegaly or hernias noted. Msk:  No deformity or scoliosis noted of thoracic or lumbar spine.   Extremities:  No clubbing, cyanosis, edema, or deformity noted with normal full range of motion of all joints.   Neurologic:  alert & oriented X3 and gait normal.   Skin:  Intact without suspicious lesions or rashes Cervical Nodes:  No lymphadenopathy noted Axillary Nodes:  No palpable lymphadenopathy Psych:  Cognition and judgment appear intact. Alert and cooperative with normal attention span and concentration. No apparent delusions, illusions, hallucinations     Assessment & Plan:

## 2017-01-14 NOTE — Assessment & Plan Note (Signed)
Well controlled.  No changes made. 

## 2017-01-14 NOTE — Progress Notes (Signed)
Pre visit review using our clinic review tool, if applicable. No additional management support is needed unless otherwise documented below in the visit note. 

## 2017-01-14 NOTE — Assessment & Plan Note (Signed)
Reviewed preventive care protocols, scheduled due services, and updated immunizations Discussed nutrition, exercise, diet, and healthy lifestyle.  

## 2017-02-23 ENCOUNTER — Ambulatory Visit (INDEPENDENT_AMBULATORY_CARE_PROVIDER_SITE_OTHER): Payer: 59 | Admitting: Family Medicine

## 2017-02-23 ENCOUNTER — Encounter: Payer: Self-pay | Admitting: Family Medicine

## 2017-02-23 DIAGNOSIS — S40261A Insect bite (nonvenomous) of right shoulder, initial encounter: Secondary | ICD-10-CM

## 2017-02-23 DIAGNOSIS — W57XXXA Bitten or stung by nonvenomous insect and other nonvenomous arthropods, initial encounter: Secondary | ICD-10-CM | POA: Insufficient documentation

## 2017-02-23 NOTE — Progress Notes (Signed)
Subjective:   Patient ID: [name], female    DOB: July 04, 1966, 51 y.o.   MRN: [id number]  TARAHJI RAMTHUN is a pleasant 51 y.o. year old female who presents to clinic today with Insect Bite  on 02/23/2017  HPI:  Found a tick on her right shoulder over the weekend.  She does not think it was embedded for long. Removed the entire tick with tweezers.  There is now a "bump" where tick was removed.  No rashes, fever, headache, abdominal pain, nausea, vomiting or other systemic symptoms.  Current Outpatient Prescriptions on File Prior to Visit  Medication Sig Dispense Refill  . diphenhydrAMINE (BENADRYL) 25 MG tablet Take 25 mg by mouth at bedtime as needed for itching.    . hydrochlorothiazide (MICROZIDE) 12.5 MG capsule Take 1 capsule (12.5 mg total) by mouth daily. 30 capsule 5  . varenicline (CHANTIX STARTING MONTH PAK) 0.5 MG X 11 & 1 MG X 42 tablet Take one 0.5 mg tablet by mouth once daily for 3 days, then increase to one 0.5 mg tablet twice daily for 4 days, then increase to one 1 mg tablet twice daily. 53 tablet 0  . Vitamin D, Ergocalciferol, (DRISDOL) 50000 units CAPS capsule Take 1 capsule (50,000 Units total) by mouth once a week. 30 capsule 0   No current facility-administered medications on file prior to visit.     Allergies  Allergen Reactions  . Codeine   . Latex     Past Medical History:  Diagnosis Date  . Abnormal uterine bleeding   . Anemia   . Arthritis   . Arthritis   . HTN (hypertension)   . Obesity   . Obesity   . Smoker   . Stress fracture   . SVT (supraventricular tachycardia) (HCC)   . SVT (supraventricular tachycardia) (HCC)   . Vitamin D deficiency     Past Surgical History:  Procedure Laterality Date  . APPENDECTOMY    . CESAREAN SECTION     x 2  . DERMOID CYST  EXCISION    . hydrothermal ablation     uteral and cyst removal  . HYSTEROSCOPY    . KNEE SURGERY     right x 2  . LEFT OOPHORECTOMY    . OVARIAN CYST SURGERY     left  . SALPINGECTOMY     right; adhesions  . TUBAL LIGATION      Family History  Problem Relation Age of Onset  . Diabetes Brother   . Seizures Brother   . Colon cancer Neg Hx   . Cancer Maternal Grandmother         gallbladder  . Stroke Paternal Grandfather   . Stroke Brother   . Colon polyps Mother   . Heart disease Father   . Diabetes Father     Social History   Social History  . Marital status: Married    Spouse name: N/A  . Number of children: 2  . Years of education: N/A   Occupational History  . 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 Smoking status: Current Every Day Smoker    Packs/day: 1.00    Years: 25.00    Types: Cigarettes  . Smokeless tobacco: Never Used  . Alcohol use No  . Drug use: No  . Sexual activity: Yes    Partners: Male    Birth control/ protection: Surgical   Other Topics Concern  . Not on file  Social History Narrative  . No narrative on file   The PMH, PSH, Social History, Family History, Medications, and allergies [facility], and have been updated if relevant.   Review of Systems  Constitutional: Negative.   Eyes: Negative.   Respiratory: Negative.   Cardiovascular: Negative.   Gastrointestinal: Negative.   Musculoskeletal: Negative.   Skin: Negative for rash.  Neurological: Negative.   Hematological: Negative.   All other systems reviewed and are negative.      Objective:    BP 140/90   Pulse 88   Temp 98.1 F (36.7 C)   Ht 5' 6.25" (1.683 m)   Wt 205 lb (93 kg)   SpO2 98%   BMI 32.84 kg/m    Physical Exam  Constitutional: She is oriented to person, place, and time. She appears well-developed and well-nourished. No distress.  HENT:  Head: Normocephalic and atraumatic.  Eyes: Conjunctivae are normal.  Cardiovascular: Normal rate.   Pulmonary/Chest: Effort normal.  Musculoskeletal: Normal range of motion.  Neurological: She is alert and oriented to person, place, and  time. No cranial nerve deficit.  Skin: She is not diaphoretic.     Psychiatric: She has a normal mood and affect. Her behavior is normal. Judgment and thought content normal.  Nursing note and vitals reviewed.         Assessment & Plan:   No diagnosis found. No Follow-up on file.

## 2017-02-23 NOTE — Assessment & Plan Note (Signed)
New- s/p tick removal at home. With localized inflammatory reaction, no signs of infection. No rashes or other signs/symptoms of tick borne illness. Reassurance provided. Call or return to clinic prn if these symptoms worsen or fail to improve as anticipated. The patient indicates understanding of these issues and agrees with the plan.

## 2017-03-06 ENCOUNTER — Other Ambulatory Visit: Payer: Self-pay | Admitting: Family Medicine

## 2017-03-12 ENCOUNTER — Ambulatory Visit: Payer: Self-pay | Admitting: Family Medicine

## 2017-03-16 ENCOUNTER — Encounter: Payer: Self-pay | Admitting: Family Medicine

## 2017-03-16 ENCOUNTER — Ambulatory Visit (INDEPENDENT_AMBULATORY_CARE_PROVIDER_SITE_OTHER): Payer: 59 | Admitting: Family Medicine

## 2017-03-16 VITALS — BP 130/80 | HR 78 | Ht 66.25 in | Wt 206.0 lb

## 2017-03-16 DIAGNOSIS — F4322 Adjustment disorder with anxiety: Secondary | ICD-10-CM | POA: Diagnosis not present

## 2017-03-16 MED ORDER — ALPRAZOLAM 0.25 MG PO TABS: 0.2500 mg | ORAL_TABLET | Freq: Two times a day (BID) | ORAL | 0 refills | Status: DC | PRN

## 2017-03-16 NOTE — Progress Notes (Signed)
Subjective:   Patient ID: [name], female    DOB: 1965-12-07, 51 y.o.   MRN: [id number]  [name] is a pleasant 51 y.o. year old female who presents to clinic today with Stress  on 03/16/2017  HPI:  Under more stress lately- financially and "life in general." Husband lost his job. She is finding herself becoming anxious at times, having panic attacks. This is new for her. Not sleeping great but this is a chronic issue for her.  Denies feeling depressed.  No SI or HI.    Current Outpatient Prescriptions on File Prior to Visit  Medication Sig Dispense Refill  . diphenhydrAMINE (BENADRYL) 25 MG tablet Take 25 mg by mouth at bedtime as needed for itching.    . hydrochlorothiazide (MICROZIDE) 12.5 MG capsule Take 1 capsule (12.5 mg total) by mouth daily. 30 capsule 5  . varenicline (CHANTIX STARTING MONTH PAK) 0.5 MG X 11 & 1 MG X 42 tablet Take one 0.5 mg tablet by mouth once daily for 3 days, then increase to one 0.5 mg tablet twice daily for 4 days, then increase to one 1 mg tablet twice daily. 53 tablet 0  . Vitamin D, Ergocalciferol, (DRISDOL) 50000 units CAPS capsule Take 1 capsule (50,000 Units total) by mouth once a week. 30 capsule 0  . Vitamin D, Ergocalciferol, (DRISDOL) 50000 units CAPS capsule TAKE ONE CAPSULE BY MOUTH ONE TIME PER WEEK 6 capsule 0   No current facility-administered medications on file prior to visit.     Allergies  Allergen Reactions  . Codeine   . Latex     Past Medical History:  Diagnosis Date  . Abnormal uterine bleeding   . Anemia   . Arthritis   . Arthritis   . HTN (hypertension)   . Obesity   . Obesity   . Smoker   . Stress fracture   . SVT (supraventricular tachycardia) (HCC)   . SVT (supraventricular tachycardia) (HCC)   . Vitamin D deficiency     Past Surgical History:  Procedure Laterality Date  . APPENDECTOMY    . CESAREAN SECTION     x 2  . DERMOID CYST  EXCISION    . hydrothermal ablation     uteral and  cyst removal  . HYSTEROSCOPY    . KNEE SURGERY     right x 2  . LEFT OOPHORECTOMY    . OVARIAN CYST SURGERY     left  . SALPINGECTOMY     right; adhesions  . TUBAL LIGATION      Family History  Problem Relation Age of Onset  . Diabetes Brother   . Seizures Brother   . Colon cancer Neg Hx   . Cancer Maternal Grandmother         gallbladder  . Stroke Paternal Grandfather   . Stroke Brother   . Colon polyps Mother   . Heart disease Father   . Diabetes Father     Social History   Social History  . Marital status: Married    Spouse name: N/A  . Number of children: 2  . Years of education: N/A   Occupational History  . 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 Smoking status: Current Every Day Smoker    Packs/day: 1.00    Years: 25.00    Types: Cigarettes  . Smokeless tobacco: Never Used  . Alcohol use No  . Drug use: No  . Sexual activity:  Yes    Partners: Male    Birth control/ protection: Surgical   Other Topics Concern  . Not on file   Social History Narrative  . No narrative on file   The PMH, PSH, Social History, Family History, Medications, and allergies have been reviewed in [facility], and have been updated if relevant.   Review of Systems  Psychiatric/Behavioral: Positive for sleep disturbance. Negative for agitation, behavioral problems, confusion, decreased concentration, dysphoric mood, hallucinations, self-injury and suicidal ideas. The patient is nervous/anxious. The patient is not hyperactive.   All other systems reviewed and are negative.      Objective:    BP 130/80   Pulse 78   Ht 5' 6.25" (1.683 m)   Wt 206 lb (93.4 kg)   SpO2 98%   BMI 33.00 kg/m    Physical Exam   General:  Well-developed,well-nourished,in no acute distress; alert,appropriate and cooperative throughout examination Head:  normocephalic and atraumatic.   Eyes:  vision grossly intact, PERRL Ears:  R ear normal and L ear normal externally, TMs  clear bilaterally Nose:  no external deformity.   Mouth:  good dentition.   Neck:  No deformities, masses, or tenderness noted. Lungs:  Normal respiratory effort, chest expands symmetrically. Lungs are clear to auscultation, no crackles or wheezes. Heart:  Normal rate and regular rhythm. S1 and S2 normal without gallop, murmur, click, rub or other extra sounds. Msk:  No deformity or scoliosis noted of thoracic or lumbar spine.   Extremities:  No clubbing, cyanosis, edema, or deformity noted with normal full range of motion of all joints.   Neurologic:  alert & oriented X3 and gait normal.   Skin:  Intact without suspicious lesions or rashes Psych:  Cognition and judgment appear intact. Alert and cooperative with normal attention span and concentration. No apparent delusions, illusions, hallucinations       Assessment & Plan:   Adjustment disorder with anxious mood - Plan: Ambulatory referral to Psychology No Follow-up on file.

## 2017-03-16 NOTE — Patient Instructions (Signed)
Good to see you. Happy birthday!   Use as xanax sparingly as needed for anxiety.  We are also referring you to see a therapist.  Please keep me updated.

## 2017-03-16 NOTE — Assessment & Plan Note (Signed)
>  25 minutes spent in face to face time with patient, >50% spent in counselling or coordination of care. Refer to psychotherapy. She is leery to start an SSRI or other daily rx. Rx printed for xanax to use sparingly as needed for a panic attack.  Discussed sedation and addiction potential. Call or return to clinic prn if these symptoms worsen or fail to improve as anticipated. The patient indicates understanding of these issues and agrees with the plan.

## 2017-04-02 ENCOUNTER — Other Ambulatory Visit: Payer: Self-pay | Admitting: Family Medicine

## 2017-05-02 ENCOUNTER — Other Ambulatory Visit: Payer: Self-pay | Admitting: Family Medicine

## 2017-07-16 ENCOUNTER — Encounter: Payer: Self-pay | Admitting: Gastroenterology

## 2017-07-22 ENCOUNTER — Other Ambulatory Visit: Payer: Self-pay | Admitting: Family Medicine

## 2017-08-19 ENCOUNTER — Other Ambulatory Visit (INDEPENDENT_AMBULATORY_CARE_PROVIDER_SITE_OTHER): Payer: 59

## 2017-08-19 VITALS — BP 129/78 | HR 78

## 2017-08-19 DIAGNOSIS — R300 Dysuria: Secondary | ICD-10-CM

## 2017-08-19 LAB — POCT URINALYSIS DIPSTICK
BILIRUBIN UA: NEGATIVE
Glucose, UA: NEGATIVE
KETONES UA: NEGATIVE
Leukocytes, UA: NEGATIVE
PH UA: 5 (ref 5.0–8.0)
Spec Grav, UA: 1.01 (ref 1.010–1.025)
UROBILINOGEN UA: 0.2 U/dL

## 2017-08-19 NOTE — Progress Notes (Addendum)
Patient presented to the office today for a urine check. Patient reported having some  Dysuria for the last couple of days. Patient would like for her urine to be check today. Patient urine was positive for large amount of blood. I have advised patient I will send urine off to be culture and we will call her with results.

## 2017-08-19 NOTE — Addendum Note (Signed)
Addended by: Cheree DittoGRAHAM, DEMETRICE A on: 08/19/2017 04:52 PM   Modules accepted: Orders

## 2017-08-22 LAB — URINE CULTURE

## 2017-08-25 ENCOUNTER — Encounter: Payer: Self-pay | Admitting: Family Medicine

## 2017-08-25 ENCOUNTER — Ambulatory Visit: Payer: 59 | Admitting: Family Medicine

## 2017-08-25 DIAGNOSIS — N3000 Acute cystitis without hematuria: Secondary | ICD-10-CM | POA: Diagnosis not present

## 2017-08-25 DIAGNOSIS — J069 Acute upper respiratory infection, unspecified: Secondary | ICD-10-CM | POA: Diagnosis not present

## 2017-08-25 DIAGNOSIS — N390 Urinary tract infection, site not specified: Secondary | ICD-10-CM | POA: Insufficient documentation

## 2017-08-25 MED ORDER — FLUCONAZOLE 150 MG PO TABS: 150.0000 mg | ORAL_TABLET | Freq: Once | ORAL | 0 refills | Status: AC

## 2017-08-25 MED ORDER — CEPHALEXIN 500 MG PO CAPS: 500.0000 mg | ORAL_CAPSULE | Freq: Two times a day (BID) | ORAL | 0 refills | Status: DC

## 2017-08-25 NOTE — Progress Notes (Signed)
Subjective:   Patient ID: [name], female    DOB: [birth year], 52 y.o.   MRN: [id number]  [name] is a pleasant 52 y.o. year old female who presents to clinic today with Urinary Tract Infection (Patient is here today C/O a UTI which she had a U/A & C&S completed on 1.9.2019 and has yet to be treated.) and Otalgia (Patient is also C/O AD pain.  It worsens as the day goes by and there is beneath it.  She has mild periorbital sinus pressure and post-nasal drip.  Denies any cough.  Sx start on Saturday and has Tx with OTC Dimetap DM but Sx persist.)  on 08/25/2017  HPI:  UTI- dysuria, increased urinary frequency for over a week.  Had  UA and Urine cx done on 08/19/17 at gyn by no one has yet to call her with results. Urine cx reviewed- >100,000 pansensitive ecoli  URI- has been having sinus pressure, nasal congestion, PND and ear pain for 4 days.  Cough is progressing.  Taking OTC dimetap DM but symptoms persist. No fever.    Current Outpatient Medications on File Prior to Visit  Medication Sig Dispense Refill  . diphenhydrAMINE (BENADRYL) 25 MG tablet Take 25 mg by mouth at bedtime as needed for itching.    . hydrochlorothiazide (MICROZIDE) 12.5 MG capsule Take 1 capsule (12.5 mg total) by mouth daily. 30 capsule 5  . Vitamin D, Ergocalciferol, (DRISDOL) 50000 units CAPS capsule TAKE ONE CAPSULE BY MOUTH ONE TIME PER WEEK 6 capsule 0   No current facility-administered medications on file prior to visit.     Allergies  Allergen Reactions  . Codeine   . Latex     Past Medical History:  Diagnosis Date  . Abnormal uterine bleeding   . Anemia   . Arthritis   . Arthritis   . HTN (hypertension)   . Obesity   . Obesity   . Smoker   . Stress fracture   . SVT (supraventricular tachycardia) (HCC)   . SVT (supraventricular tachycardia) (HCC)   . Vitamin D deficiency     Past Surgical History:  Procedure Laterality Date  . APPENDECTOMY    . CESAREAN SECTION     x 2    . DERMOID CYST  EXCISION    . hydrothermal ablation     uteral and cyst removal  . HYSTEROSCOPY    . KNEE SURGERY     right x 2  . LEFT OOPHORECTOMY    . OVARIAN CYST SURGERY     left  . SALPINGECTOMY     right; adhesions  . TUBAL LIGATION      Family History  Problem Relation Age of Onset  . Diabetes Brother   . Seizures Brother   . Colon cancer Neg Hx   . Cancer Maternal Grandmother         gallbladder  . Stroke Paternal Grandfather   . Stroke Brother   . Colon polyps Mother   . Heart disease Father   . Diabetes Father     Social History   Socioeconomic History  . Marital status: Married    Spouse name: Not on file  . Number of children: 2  . Years of education: Not on file  . Highest education level: Not on file  Social Needs  . Financial resource strain: Not on file  . Food insecurity - worry: Not on file  . Food insecurity - inability: Not on file  .  Transportation needs - medical: Not on file  . Transportation needs - non-medical: Not on file  Occupational History  . Occupation: ADMIN ASST    Employer: [employer]  Tobacco Use  . Smoking status: Current Every Day Smoker    Packs/day: 1.00    Years: 25.00    Pack years: 25.00    Types: Cigarettes  . Smokeless tobacco: Never Used  Substance and Sexual Activity  . Alcohol use: No    Alcohol/week: 0.0 oz  . Drug use: No  . Sexual activity: Yes    Partners: Male    Birth control/protection: Surgical  Other Topics Concern  . Not on file  Social History Narrative  . Not on file   The PMH, PSH, Social History, Family History, Medications, and allergies have been reviewed in [facility], and have been updated if relevant.   Review of Systems  Constitutional: Negative.   HENT: Positive for congestion, postnasal drip, rhinorrhea, sinus pressure and sinus pain. Negative for dental problem, drooling, ear discharge, ear pain, facial swelling, hearing loss, mouth sores, nosebleeds, sneezing, sore throat,  tinnitus, trouble swallowing and voice change.   Respiratory: Positive for cough. Negative for wheezing and stridor.   Cardiovascular: Negative.   Gastrointestinal: Negative.   Genitourinary: Positive for frequency and urgency. Negative for decreased urine volume, difficulty urinating, dyspareunia, dysuria, enuresis, flank pain, genital sores, hematuria, menstrual problem, pelvic pain, vaginal bleeding, vaginal discharge and vaginal pain.  Musculoskeletal: Negative.   Allergic/Immunologic: Negative.   Neurological: Negative.   Hematological: Negative.   Psychiatric/Behavioral: Negative.   All other systems reviewed and are negative.      Objective:    BP 116/80 (BP Location: Left Arm, Patient Position: Sitting, Cuff Size: Normal)   Pulse 87   Temp 98.4 F (36.9 C) (Oral)   Ht 5' 6.25" (1.683 m)   Wt 219 lb 9.6 oz (99.6 kg)   SpO2 98%   BMI 35.18 kg/m    Physical Exam  Constitutional: She is oriented to person, place, and time. She appears well-developed and well-nourished. No distress.  HENT:  Right Ear: Hearing and tympanic membrane normal.  Left Ear: Hearing and tympanic membrane normal.  Nose: Mucosal edema and rhinorrhea present. Right sinus exhibits no maxillary sinus tenderness and no frontal sinus tenderness. Left sinus exhibits frontal sinus tenderness. Left sinus exhibits no maxillary sinus tenderness.  Mouth/Throat: Uvula is midline.  +PND  Cardiovascular: Normal rate and regular rhythm.  Pulmonary/Chest: Effort normal and breath sounds normal. No respiratory distress. She has no wheezes.  Abdominal: Soft. She exhibits no distension. There is no tenderness.  Musculoskeletal: Normal range of motion. She exhibits no edema.  Neurological: She is alert and oriented to person, place, and time. No cranial nerve deficit.  Skin: Skin is warm and dry. She is not diaphoretic.  Psychiatric: She has a normal mood and affect. Her behavior is normal. Judgment and thought content  normal.  Nursing note and vitals reviewed.         Assessment & Plan:   Acute cystitis without hematuria  Upper respiratory tract infection, unspecified type No Follow-up on file.

## 2017-08-25 NOTE — Assessment & Plan Note (Signed)
New- eRx sent for Keflex 500 mg twice daily x 7 days. Push fluids. Call or return to clinic prn if these symptoms worsen or fail to improve as anticipated. The patient indicates understanding of these issues and agrees with the plan.

## 2017-08-25 NOTE — Assessment & Plan Note (Signed)
Likely viral. Advised supportive care. Call or return to clinic prn if these symptoms worsen or fail to improve as anticipated. The patient indicates understanding of these issues and agrees with the plan.  

## 2017-08-31 ENCOUNTER — Telehealth: Payer: Self-pay | Admitting: Family Medicine

## 2017-08-31 ENCOUNTER — Other Ambulatory Visit: Payer: Self-pay

## 2017-08-31 MED ORDER — HYDROCHLOROTHIAZIDE 12.5 MG PO CAPS: 12.5000 mg | ORAL_CAPSULE | Freq: Every day | ORAL | 5 refills | Status: DC

## 2017-08-31 NOTE — Telephone Encounter (Signed)
Copied from CRM [phone]. Topic: General - Other >> Aug 31, 2017 12:22 PM [name], [name], RMA wrote: Reason for CRM: Medication refill request for Hydrochlorothiazide 12.5 mg to be sent to [facility] [address]

## 2017-09-25 ENCOUNTER — Other Ambulatory Visit: Payer: Self-pay | Admitting: Family Medicine

## 2017-09-28 ENCOUNTER — Ambulatory Visit: Payer: 59 | Admitting: Family Medicine

## 2017-09-28 ENCOUNTER — Encounter: Payer: Self-pay | Admitting: Family Medicine

## 2017-09-28 VITALS — BP 116/78 | HR 87 | Temp 98.7°F | Wt 221.5 lb

## 2017-09-28 DIAGNOSIS — J069 Acute upper respiratory infection, unspecified: Secondary | ICD-10-CM | POA: Diagnosis not present

## 2017-09-28 MED ORDER — AZITHROMYCIN 250 MG PO TABS: ORAL_TABLET | ORAL | 0 refills | Status: DC

## 2017-09-28 MED ORDER — BENZONATATE 100 MG PO CAPS: 100.0000 mg | ORAL_CAPSULE | Freq: Three times a day (TID) | ORAL | 0 refills | Status: DC | PRN

## 2017-09-28 NOTE — Progress Notes (Signed)
Subjective:    Patient ID: [name], female    DOB: 10/08/1965, 52 y.o.   MRN: [id number]  HPI This is a 52 yo female who presents today with cough and runny nose x 4 days. Fever 101.7 yesterday. None today. Cough now dry, was productive. No wheeze or SOB. Nose is alternating post nasal drainage, congestion. Has been taking benadryl, robitussin chest and congestion, temporary relief. Achy and fatigued. Good fluid intake.  Sick contacts at work.  Smokes 1 ppd.   Past Medical History:  Diagnosis Date  . Abnormal uterine bleeding   . Anemia   . Arthritis   . Arthritis   . HTN (hypertension)   . Obesity   . Obesity   . Smoker   . Stress fracture   . SVT (supraventricular tachycardia) (HCC)   . SVT (supraventricular tachycardia) (HCC)   . Vitamin D deficiency    Past Surgical History:  Procedure Laterality Date  . APPENDECTOMY    . CESAREAN SECTION     x 2  . DERMOID CYST  EXCISION    . hydrothermal ablation     uteral and cyst removal  . HYSTEROSCOPY    . KNEE SURGERY     right x 2  . LEFT OOPHORECTOMY    . OVARIAN CYST SURGERY     left  . SALPINGECTOMY     right; adhesions  . TUBAL LIGATION     Family History  Problem Relation Age of Onset  . Diabetes Brother   . Seizures Brother   . Colon cancer Neg Hx   . Cancer Maternal Grandmother         gallbladder  . Stroke Paternal Grandfather   . Stroke Brother   . Colon polyps Mother   . Heart disease Father   . Diabetes Father    Social History   Tobacco Use  . Smoking status: Current Every Day Smoker    Packs/day: 1.00    Years: 25.00    Pack years: 25.00    Types: Cigarettes  . Smokeless tobacco: Never Used  Substance Use Topics  . Alcohol use: No    Alcohol/week: 0.0 oz  . Drug use: No      Review of Systems Per HPI    Objective:   Physical Exam  Constitutional: She is oriented to person, place, and time. She appears well-developed and well-nourished. She appears ill. No distress.    HENT:  Head: Normocephalic and atraumatic.  Right Ear: Tympanic membrane, external ear and ear canal normal.  Left Ear: Tympanic membrane, external ear and ear canal normal.  Nose: Mucosal edema and rhinorrhea present.  Mouth/Throat: Uvula is midline and oropharynx is clear and moist.  Eyes: Conjunctivae are normal.  Neck: Normal range of motion. Neck supple.  Cardiovascular: Normal rate, regular rhythm and normal heart sounds.  Pulmonary/Chest: Effort normal and breath sounds normal.  Lymphadenopathy:    She has no cervical adenopathy.  Neurological: She is alert and oriented to person, place, and time.  Skin: Skin is warm and dry. She is not diaphoretic.  Psychiatric: She has a normal mood and affect. Her behavior is normal. Judgment and thought content normal.  Vitals reviewed.     BP 116/78   Pulse 87   Temp 98.7 F (37.1 C) (Oral)   Wt 221 lb 8 oz (100.5 kg)   SpO2 96%   BMI 35.48 kg/m  Wt Readings from Last 3 Encounters:  09/28/17 221 lb 8 oz (100.5  kg)  08/25/17 219 lb 9.6 oz (99.6 kg)  03/16/17 206 lb (93.4 kg)       Assessment & Plan:  1. URI with cough and congestion - Provided written and verbal information regarding diagnosis and treatment. - provided wait and see antibiotic (Z-pack) -  Patient Instructions  For nasal congestion you can use Afrin nasal spray for 3 days max, saline nasal spray (generic is fine for all). Drink enough fluids to make your urine light yellow. I have sent in a prescription for a cough pill  If not better in 4-5 days, start antibiotic For fever/chill/muscle aches you can take over the counter acetaminophen or ibuprofen (2-3 tablets every 8-12 hours).  Please come back in if you are not better in 5-7 days or if you develop wheezing, shortness of breath or persistent vomiting.    - benzonatate (TESSALON) 100 MG capsule; Take 1-2 capsules (100-200 mg total) by mouth 3 (three) times daily as needed for cough.  Dispense: 30  capsule; Refill: 0   Olean Ree, FNP-BC  [facility], [facility]  09/28/2017 11:19 AM

## 2017-09-28 NOTE — Patient Instructions (Signed)
For nasal congestion you can use Afrin nasal spray for 3 days max, saline nasal spray (generic is fine for all). Drink enough fluids to make your urine light yellow. I have sent in a prescription for a cough pill  If not better in 4-5 days, start antibiotic For fever/chill/muscle aches you can take over the counter acetaminophen or ibuprofen (2-3 tablets every 8-12 hours).  Please come back in if you are not better in 5-7 days or if you develop wheezing, shortness of breath or persistent vomiting.

## 2017-10-15 DIAGNOSIS — L918 Other hypertrophic disorders of the skin: Secondary | ICD-10-CM | POA: Diagnosis not present

## 2017-10-21 DIAGNOSIS — L570 Actinic keratosis: Secondary | ICD-10-CM | POA: Diagnosis not present

## 2017-10-21 DIAGNOSIS — L821 Other seborrheic keratosis: Secondary | ICD-10-CM | POA: Diagnosis not present

## 2017-10-21 DIAGNOSIS — I788 Other diseases of capillaries: Secondary | ICD-10-CM | POA: Diagnosis not present

## 2017-10-21 DIAGNOSIS — L853 Xerosis cutis: Secondary | ICD-10-CM | POA: Diagnosis not present

## 2017-10-27 ENCOUNTER — Other Ambulatory Visit: Payer: Self-pay | Admitting: Family Medicine

## 2017-10-27 NOTE — Telephone Encounter (Signed)
This was faxed in on 2.15.19/thx dmf

## 2017-11-04 ENCOUNTER — Other Ambulatory Visit: Payer: Self-pay

## 2017-11-04 MED ORDER — VITAMIN D (ERGOCALCIFEROL) 1.25 MG (50000 UNIT) PO CAPS: 50000.0000 [IU] | ORAL_CAPSULE | ORAL | 0 refills | Status: DC

## 2018-01-18 ENCOUNTER — Ambulatory Visit (INDEPENDENT_AMBULATORY_CARE_PROVIDER_SITE_OTHER): Payer: 59 | Admitting: Family Medicine

## 2018-01-18 ENCOUNTER — Encounter: Payer: Self-pay | Admitting: Family Medicine

## 2018-01-18 VITALS — BP 126/86 | HR 90 | Temp 98.6°F | Ht 66.75 in | Wt 217.2 lb

## 2018-01-18 DIAGNOSIS — F4321 Adjustment disorder with depressed mood: Secondary | ICD-10-CM | POA: Diagnosis not present

## 2018-01-18 DIAGNOSIS — I10 Essential (primary) hypertension: Secondary | ICD-10-CM

## 2018-01-18 DIAGNOSIS — Z0000 Encounter for general adult medical examination without abnormal findings: Secondary | ICD-10-CM | POA: Diagnosis not present

## 2018-01-18 DIAGNOSIS — F17200 Nicotine dependence, unspecified, uncomplicated: Secondary | ICD-10-CM

## 2018-01-18 DIAGNOSIS — E559 Vitamin D deficiency, unspecified: Secondary | ICD-10-CM

## 2018-01-18 DIAGNOSIS — D509 Iron deficiency anemia, unspecified: Secondary | ICD-10-CM

## 2018-01-18 LAB — COMPREHENSIVE METABOLIC PANEL
ALT: 8 U/L (ref 0–35)
AST: 18 U/L (ref 0–37)
Albumin: 4.5 g/dL (ref 3.5–5.2)
Alkaline Phosphatase: 62 U/L (ref 39–117)
BUN: 10 mg/dL (ref 6–23)
CALCIUM: 10 mg/dL (ref 8.4–10.5)
CO2: 30 mEq/L (ref 19–32)
Chloride: 102 mEq/L (ref 96–112)
Creatinine, Ser: 0.84 mg/dL (ref 0.40–1.20)
GFR: 76.03 mL/min (ref >60.00)
Glucose, Bld: 98 mg/dL (ref 70–99)
POTASSIUM: 4.2 meq/L (ref 3.5–5.1)
Sodium: 140 mEq/L (ref 135–145)
TOTAL PROTEIN: 7.2 g/dL (ref 6.0–8.3)
Total Bilirubin: 0.8 mg/dL (ref 0.2–1.2)

## 2018-01-18 LAB — CBC WITH DIFFERENTIAL/PLATELET
BASOS ABS: 0.1 10*3/uL (ref 0.0–0.1)
Basophils Relative: 1 % (ref 0.0–3.0)
EOS PCT: 2.9 % (ref 0.0–5.0)
Eosinophils Absolute: 0.2 10*3/uL (ref 0.0–0.7)
HEMATOCRIT: 44.1 % (ref 36.0–46.0)
Hemoglobin: 15.3 g/dL — ABNORMAL HIGH (ref 12.0–15.0)
LYMPHS PCT: 43.9 % (ref 12.0–46.0)
Lymphs Abs: 3.3 10*3/uL (ref 0.7–4.0)
MCHC: 34.8 g/dL (ref 30.0–36.0)
MCV: 89.9 fl (ref 78.0–100.0)
MONOS PCT: 5.9 % (ref 3.0–12.0)
Monocytes Absolute: 0.4 10*3/uL (ref 0.1–1.0)
NEUTROS ABS: 3.5 10*3/uL (ref 1.4–7.7)
NEUTROS PCT: 46.3 % (ref 43.0–77.0)
PLATELETS: 348 10*3/uL (ref 150.0–400.0)
RBC: 4.9 Mil/uL (ref 3.87–5.11)
RDW: 12.9 % (ref 11.5–15.5)
WBC: 7.5 10*3/uL (ref 4.0–10.5)

## 2018-01-18 LAB — FERRITIN: Ferritin: 56.1 ng/mL (ref 10.0–291.0)

## 2018-01-18 LAB — VITAMIN D 25 HYDROXY (VIT D DEFICIENCY, FRACTURES): VITD: 32.24 ng/mL (ref 30.00–100.00)

## 2018-01-18 LAB — LIPID PANEL
CHOLESTEROL: 218 mg/dL — AB (ref 0–200)
HDL: 37.1 mg/dL — ABNORMAL LOW (ref >39.00)
LDL CALC: 148 mg/dL — AB (ref 0–99)
NonHDL: 181.34
TRIGLYCERIDES: 167 mg/dL — AB (ref 0.0–149.0)
Total CHOL/HDL Ratio: 6
VLDL: 33.4 mg/dL (ref 0.0–40.0)

## 2018-01-18 LAB — TSH: TSH: 0.98 u[IU]/mL (ref 0.35–4.50)

## 2018-01-18 MED ORDER — BUPROPION HCL ER (SMOKING DET) 150 MG PO TB12: 150.0000 mg | ORAL_TABLET | Freq: Two times a day (BID) | ORAL | 3 refills | Status: DC

## 2018-01-18 NOTE — Assessment & Plan Note (Signed)
Reviewed preventive care protocols, scheduled due services, and updated immunizations Discussed nutrition, exercise, diet, and healthy lifestyle.  Orders Placed This Encounter  Procedures  . Vitamin D (25 hydroxy)  . CBC with Differential/Platelet  . Comprehensive metabolic panel  . Lipid panel  . TSH

## 2018-01-18 NOTE — Assessment & Plan Note (Signed)
Well controlled.  No changes made. Due for labs today.

## 2018-01-18 NOTE — Assessment & Plan Note (Signed)
CBC today.  

## 2018-01-18 NOTE — Assessment & Plan Note (Signed)
Check Vit D today. 

## 2018-01-18 NOTE — Assessment & Plan Note (Signed)
She is ready to quit and willing to try zyban. eRx sent. She will update me.

## 2018-01-18 NOTE — Patient Instructions (Addendum)
Great to see you. I will call you with your lab results from today and you can view them online.   Cologuard will be mailed directly to your house.  We are starting Zyban 150 mg twice daily- please keep me updated. You can do this!

## 2018-01-18 NOTE — Progress Notes (Signed)
Subjective:    Patient ID: [name], female    DOB: 09-15-1965, 52 y.o.   MRN: [id number]  HPI  52 yo pleasant female here for CPX without complaints.  OBGYN- Dr. Marice Potterove.  She is scheduled for pap smear with her and mammogram on 01/27/18. Remote h/o ablation  Health Maintenance  Topic Date Due  . HIV Screening  04/02/1982  . COLONOSCOPY  04/02/2017  . MAMMOGRAM  12/13/2017  . INFLUENZA VACCINE  04/27/2018 (Originally 03/11/2018)  . PAP SMEAR  12/14/2019  . TETANUS/TDAP  09/10/2021   Has never had a colonoscopy.  Interested in cologuard.  Had skin cancer screening by dermatology (Dr. Terri PiedraLupton) in 10/2017.  Tobacco abuse- chantix made her nauseated by the 3rd week. Currently smoking about a pack a day.  HTN- has been well controlled on HCTZ 12.5 mg daily. Denies any LE edema, HA, blurred vision, CP or SOB. Lab Results  Component Value Date   CREATININE 0.82 01/12/2017   Wt Readings from Last 3 Encounters:  01/18/18 217 lb 3.2 oz (98.5 kg)  09/28/17 221 lb 8 oz (100.5 kg)  08/25/17 219 lb 9.6 oz (99.6 kg)   Lab Results  Component Value Date   TSH 0.96 01/12/2017   Lab Results  Component Value Date   ALT 7 01/12/2017   AST 16 01/12/2017   ALKPHOS 59 01/12/2017   BILITOT 0.5 01/12/2017   Lab Results  Component Value Date   NA 139 01/12/2017   K 4.1 01/12/2017   CL 105 01/12/2017   CO2 29 01/12/2017   Lab Results  Component Value Date   CHOL 184 01/12/2017   HDL 37.80 (L) 01/12/2017   LDLCALC 121 (H) 01/12/2017   TRIG 123.0 01/12/2017   CHOLHDL 5 01/12/2017     Lab Results  Component Value Date   VITAMINB12 417 12/28/2015    Patient Active Problem List   Diagnosis Date Noted  . Well woman exam without gynecological exam 01/18/2018  . Other and unspecified ovarian cyst 10/28/2013  . Menopause 10/28/2013  . Adjustment disorder with anxious mood 12/23/2010  . Essential hypertension 03/04/2010  . DEPRESSION, SITUATIONAL 05/15/2009  . Vitamin D  deficiency 09/25/2008  . Obesity 09/25/2008  . Iron deficiency anemia 09/25/2008  . TOBACCO ABUSE 08/25/2007   Past Medical History:  Diagnosis Date  . Abnormal uterine bleeding   . Anemia   . Arthritis   . Arthritis   . HTN (hypertension)   . Obesity   . Obesity   . Smoker   . Stress fracture   . SVT (supraventricular tachycardia) (HCC)   . SVT (supraventricular tachycardia) (HCC)   . Vitamin D deficiency    Past Surgical History:  Procedure Laterality Date  . APPENDECTOMY    . CESAREAN SECTION     x 2  . DERMOID CYST  EXCISION    . hydrothermal ablation     uteral and cyst removal  . HYSTEROSCOPY    . KNEE SURGERY     right x 2  . LEFT OOPHORECTOMY    . OVARIAN CYST SURGERY     left  . SALPINGECTOMY     right; adhesions  . TUBAL LIGATION     Social History   Tobacco Use  . Smoking status: Current Every Day Smoker    Packs/day: 1.00    Years: 25.00    Pack years: 25.00    Types: Cigarettes  . Smokeless tobacco: Never Used  Substance Use Topics  . Alcohol  use: No    Alcohol/week: 0.0 oz  . Drug use: No   Family History  Problem Relation Age of Onset  . Diabetes Brother   . Seizures Brother   . Colon cancer Neg Hx   . Cancer Maternal Grandmother         gallbladder  . Stroke Paternal Grandfather   . Stroke Brother   . Colon polyps Mother   . Heart disease Father   . Diabetes Father    Allergies  Allergen Reactions  . Codeine   . Latex     Current Outpatient Medications:  .  diphenhydrAMINE (BENADRYL) 25 MG tablet, Take 25 mg by mouth at bedtime as needed for itching., Disp: , Rfl:  .  hydrochlorothiazide (MICROZIDE) 12.5 MG capsule, Take 1 capsule (12.5 mg total) by mouth daily., Disp: 30 capsule, Rfl: 5   The PMH, PSH, Social History, Family History, Medications, and allergies have been reviewed in [facility], and have been updated if relevant.     Review of Systems  Constitutional: Positive for fatigue.  HENT: Negative.   Respiratory:  Negative.   Cardiovascular: Negative.   Gastrointestinal: Negative.   Endocrine: Negative.   Genitourinary: Negative.   Musculoskeletal: Negative for myalgias.  Skin: Negative.   Allergic/Immunologic: Negative.   Neurological: Negative.   Hematological: Negative.   Psychiatric/Behavioral: Negative.   All other systems reviewed and are negative.     Objective:   Physical Exam BP 126/86 (BP Location: Left Arm, Patient Position: Sitting, Cuff Size: Normal)   Pulse 90   Temp 98.6 F (37 C) (Oral)   Ht 5' 6.75" (1.695 m)   Wt 217 lb 3.2 oz (98.5 kg)   SpO2 97%   BMI 34.27 kg/m    General:  Well-developed,well-nourished,in no acute distress; alert,appropriate and cooperative throughout examination Head:  normocephalic and atraumatic.   Eyes:  vision grossly intact, PERRL Ears:  R ear normal and L ear normal externally, TMs clear bilaterally Nose:  no external deformity.   Mouth:  good dentition.   Neck:  No deformities, masses, or tenderness noted. Lungs:  Normal respiratory effort, chest expands symmetrically. Lungs are clear to auscultation, no crackles or wheezes. Heart:  Normal rate and regular rhythm. S1 and S2 normal without gallop, murmur, click, rub or other extra sounds. Abdomen:  Bowel sounds positive,abdomen soft and non-tender without masses, organomegaly or hernias noted. Msk:  No deformity or scoliosis noted of thoracic or lumbar spine.   Extremities:  No clubbing, cyanosis, edema, or deformity noted with normal full range of motion of all joints.   Neurologic:  alert & oriented X3 and gait normal.   Skin:  Intact without suspicious lesions or rashes Cervical Nodes:  No lymphadenopathy noted Axillary Nodes:  No palpable lymphadenopathy Psych:  Cognition and judgment appear intact. Alert and cooperative with normal attention span and concentration. No apparent delusions, illusions, hallucinations      Assessment & Plan:

## 2018-01-26 ENCOUNTER — Encounter: Payer: 59 | Admitting: Family Medicine

## 2018-01-27 DIAGNOSIS — Z01419 Encounter for gynecological examination (general) (routine) without abnormal findings: Secondary | ICD-10-CM | POA: Diagnosis not present

## 2018-02-01 ENCOUNTER — Encounter: Payer: 59 | Admitting: Family Medicine

## 2018-02-07 DIAGNOSIS — Z1212 Encounter for screening for malignant neoplasm of rectum: Secondary | ICD-10-CM | POA: Diagnosis not present

## 2018-02-07 DIAGNOSIS — Z1211 Encounter for screening for malignant neoplasm of colon: Secondary | ICD-10-CM | POA: Diagnosis not present

## 2018-02-18 LAB — COLOGUARD: COLOGUARD: NEGATIVE

## 2018-02-22 ENCOUNTER — Telehealth: Payer: Self-pay | Admitting: Nurse Practitioner

## 2018-02-22 NOTE — Telephone Encounter (Signed)
Left detail message inform the pt.  

## 2018-02-22 NOTE — Telephone Encounter (Signed)
Negative cologuard

## 2018-02-23 ENCOUNTER — Other Ambulatory Visit: Payer: Self-pay | Admitting: Family Medicine

## 2018-03-29 ENCOUNTER — Encounter: Payer: Self-pay | Admitting: Family Medicine

## 2018-03-29 ENCOUNTER — Ambulatory Visit (INDEPENDENT_AMBULATORY_CARE_PROVIDER_SITE_OTHER): Payer: 59 | Admitting: Family Medicine

## 2018-03-29 VITALS — BP 136/84 | HR 87 | Temp 99.0°F | Ht 66.75 in | Wt 219.8 lb

## 2018-03-29 DIAGNOSIS — R070 Pain in throat: Secondary | ICD-10-CM | POA: Diagnosis not present

## 2018-03-29 LAB — POCT RAPID STREP A (OFFICE): Rapid Strep A Screen: NEGATIVE

## 2018-03-29 MED ORDER — DOXYCYCLINE HYCLATE 100 MG PO TABS: 100.0000 mg | ORAL_TABLET | Freq: Two times a day (BID) | ORAL | 0 refills | Status: DC

## 2018-03-29 MED ORDER — AZITHROMYCIN 250 MG PO TABS: ORAL_TABLET | ORAL | 0 refills | Status: DC

## 2018-03-29 NOTE — Progress Notes (Signed)
SUBJECTIVE: 52 y.o. female with sore throat, myalgias, swollen glands, headache and fever for 2 days. No history of rheumatic fever. Other symptoms: dry cough.   Current Outpatient Medications on File Prior to Visit  Medication Sig Dispense Refill  . buPROPion (ZYBAN) 150 MG 12 hr tablet Take 1 tablet (150 mg total) by mouth 2 (two) times daily. 60 tablet 3  . diphenhydrAMINE (BENADRYL) 25 MG tablet Take 25 mg by mouth at bedtime as needed for itching.    . hydrochlorothiazide (MICROZIDE) 12.5 MG capsule TAKE 1 CAPSULE BY MOUTH EVERY DAY 90 capsule 1   No current facility-administered medications on file prior to visit.     Allergies  Allergen Reactions  . Codeine   . Latex     Past Medical History:  Diagnosis Date  . Abnormal uterine bleeding   . Anemia   . Arthritis   . Arthritis   . HTN (hypertension)   . Obesity   . Obesity   . Smoker   . Stress fracture   . SVT (supraventricular tachycardia) (HCC)   . SVT (supraventricular tachycardia) (HCC)   . Vitamin D deficiency     Past Surgical History:  Procedure Laterality Date  . APPENDECTOMY    . CESAREAN SECTION     x 2  . DERMOID CYST  EXCISION    . hydrothermal ablation     uteral and cyst removal  . HYSTEROSCOPY    . KNEE SURGERY     right x 2  . LEFT OOPHORECTOMY    . OVARIAN CYST SURGERY     left  . SALPINGECTOMY     right; adhesions  . TUBAL LIGATION      Family History  Problem Relation Age of Onset  . Diabetes Brother   . Seizures Brother   . Colon cancer Neg Hx   . Cancer Maternal Grandmother         gallbladder  . Stroke Paternal Grandfather   . Stroke Brother   . Colon polyps Mother   . Heart disease Father   . Diabetes Father     Social History   Socioeconomic History  . Marital status: Married    Spouse name: Not on file  . Number of children: 2  . Years of education: Not on file  . Highest education level: Not on file  Occupational History  . Occupation: ADMIN ASST    Employer:  [employer]  Social Needs  . Financial resource strain: Not on file  . Food insecurity:    Worry: Not on file    Inability: Not on file  . Transportation needs:    Medical: Not on file    Non-medical: Not on file  Tobacco Use  . Smoking status: Current Every Day Smoker    Packs/day: 1.00    Years: 25.00    Pack years: 25.00    Types: Cigarettes  . Smokeless tobacco: Never Used  Substance and Sexual Activity  . Alcohol use: No    Alcohol/week: 0.0 standard drinks  . Drug use: No  . Sexual activity: Yes    Partners: Male    Birth control/protection: Surgical  Lifestyle  . Physical activity:    Days per week: Not on file    Minutes per session: Not on file  . Stress: Not on file  Relationships  . Social connections:    Talks on phone: Not on file    Gets together: Not on file    Attends religious  service: Not on file    Active member of club or organization: Not on file    Attends meetings of clubs or organizations: Not on file    Relationship status: Not on file  . Intimate partner violence:    Fear of current or ex partner: Not on file    Emotionally abused: Not on file    Physically abused: Not on file    Forced sexual activity: Not on file  Other Topics Concern  . Not on file  Social History Narrative  . Not on file   The PMH, PSH, Social History, Family History, Medications, and allergies [facility], and have been updated if relevant.  OBJECTIVE:  BP 136/84 (BP Location: Left Arm, Patient Position: Sitting, Cuff Size: Normal)   Pulse 87   Temp 99 F (37.2 C) (Oral)   Ht 5' 6.75" (1.695 m)   Wt 219 lb 12.8 oz (99.7 kg)   SpO2 97%   BMI 34.68 kg/m   Vitals as noted above. Appears alert, well appearing, and in no distress and oriented to person, place, and time. Ears: bilateral TM's and external ear canals normal Oropharynx: erythematous Neck: supple, no significant adenopathy Lungs: clear to auscultation, no wheezes, rales or  rhonchi, symmetric air entry Rapid Strep test is negative  ASSESSMENT: Streptococcal pharyngitis  PLAN: Per orders.Neg rapid strep, but she has all classic signs and symptoms of strep throat.  Will treat with Zpack. Gargle, use acetaminophen or other OTC analgesic, and take Rx fully as prescribed. Call if other family members develop similar symptoms. See prn.

## 2018-03-29 NOTE — Patient Instructions (Signed)

## 2018-05-25 ENCOUNTER — Other Ambulatory Visit: Payer: Self-pay | Admitting: Family Medicine

## 2018-06-26 DIAGNOSIS — Z23 Encounter for immunization: Secondary | ICD-10-CM | POA: Diagnosis not present

## 2018-07-23 ENCOUNTER — Other Ambulatory Visit: Payer: Self-pay | Admitting: Family Medicine

## 2018-07-27 ENCOUNTER — Encounter: Payer: Self-pay | Admitting: Family Medicine

## 2018-07-27 ENCOUNTER — Other Ambulatory Visit: Payer: Self-pay

## 2018-07-27 MED ORDER — HYDROCHLOROTHIAZIDE 12.5 MG PO CAPS: ORAL_CAPSULE | ORAL | 1 refills | Status: DC

## 2018-12-03 ENCOUNTER — Encounter (INDEPENDENT_AMBULATORY_CARE_PROVIDER_SITE_OTHER): Payer: 59 | Admitting: Family Medicine

## 2018-12-03 DIAGNOSIS — N3000 Acute cystitis without hematuria: Secondary | ICD-10-CM | POA: Diagnosis not present

## 2018-12-03 MED ORDER — CEPHALEXIN 500 MG PO CAPS: 500.0000 mg | ORAL_CAPSULE | Freq: Two times a day (BID) | ORAL | 0 refills | Status: DC

## 2018-12-03 NOTE — Telephone Encounter (Signed)
[facility]   [facility] and is aware that copayment and coinsurance may apply. Patient was treated using telemedicine according to accepted telemedicine protocols.  Subjective:   Patient complains of increased urinary frequency and dysuria without back pain, fevers, nausea or vomiting.  She feels symptoms consistent UTI. Allergies reviewed.  No abx allergies.  eRx sent for Keflex 500 mg twice daily. X 7 days to pharmacy on file. Call or send my chart message prn if these symptoms worsen or fail to improve as anticipated. The patient indicates understanding of these issues and agrees with the plan.   Patient Active Problem List   Diagnosis Date Noted  . Well woman exam without gynecological exam 01/18/2018  . Other and unspecified ovarian cyst 10/28/2013  . Menopause 10/28/2013  . Adjustment disorder with anxious mood 12/23/2010  . Essential hypertension 03/04/2010  . DEPRESSION, SITUATIONAL 05/15/2009  . Vitamin D deficiency 09/25/2008  . Obesity 09/25/2008  . Iron deficiency anemia 09/25/2008  . TOBACCO ABUSE 08/25/2007   Social History   Tobacco Use  . Smoking status: Current Every Day Smoker    Packs/day: 1.00    Years: 25.00    Pack years: 25.00    Types: Cigarettes  . Smokeless tobacco: Never Used  Substance Use Topics  . Alcohol use: No    Alcohol/week: 0.0 standard drinks    Current Outpatient Medications:  .  azithromycin (ZITHROMAX) 250 MG tablet, 2 tabs by mouth on day 1 followed by 1 tab by mouth daily days 2- 5, Disp: 6 tablet, Rfl: 0 .  buPROPion (ZYBAN) 150 MG 12 hr tablet, TAKE 1 TABLET BY MOUTH TWICE A DAY, Disp: 60 tablet, Rfl: 3 .  cephALEXin (KEFLEX) 500 MG capsule, Take 1 capsule (500 mg total) by mouth 2 (two) times daily., Disp: 14 capsule, Rfl: 0 .  diphenhydrAMINE (BENADRYL) 25 MG tablet, Take 25 mg by mouth at bedtime as needed for itching., Disp: , Rfl:  .  hydrochlorothiazide (MICROZIDE) 12.5 MG capsule, TAKE 1 CAPSULE  BY MOUTH EVERY DAY, Disp: 90 capsule, Rfl: 1  Allergies  Allergen Reactions  . Codeine   . Latex     Assessment and Plan:   Diagnosis: symptoms consistent with uncomplicated acute cystitis. Please see myChart communication and orders below.   No orders of the defined types were placed in this encounter.  Meds ordered this encounter  Medications  . cephALEXin (KEFLEX) 500 MG capsule    Sig: Take 1 capsule (500 mg total) by mouth 2 (two) times daily.    Dispense:  14 capsule    Refill:  0    Ruthe Mannanalia Aron, MD 12/03/2018  A total of 8  minutes were spent by me to personally review the patient-generated inquiry, review patient records and data pertinent to assessment of the patient's problem, develop a management plan including generation of prescriptions and/or orders, and on subsequent communication with the patient through secure the MyChart portal service.   There is no separately reported E/M service related to this service in the past 7 days nor does the patient have an upcoming soonest available appointment for this issue. This work was completed in less than 7 days.   The patient consented to this service today (see patient agreement prior to ongoing communication). Patient counseled regarding the need for in-person exam for certain conditions and was advised to call the office if any changing or worsening symptoms occur.   The codes to be used for the E/M service  are: [x]   99421 for 5-10 minutes of time spent on the inquiry. []   [id number] for 11-20 minutes. []   [id number] for 21+ minutes.

## 2019-01-27 ENCOUNTER — Encounter: Payer: 59 | Admitting: Family Medicine

## 2019-02-25 ENCOUNTER — Other Ambulatory Visit: Payer: Self-pay | Admitting: Family Medicine

## 2019-02-25 ENCOUNTER — Encounter: Payer: Self-pay | Admitting: Family Medicine

## 2019-02-25 MED ORDER — HYDROCHLOROTHIAZIDE 12.5 MG PO CAPS: ORAL_CAPSULE | ORAL | 1 refills | Status: DC

## 2019-03-25 ENCOUNTER — Encounter: Payer: Self-pay | Admitting: Family Medicine

## 2019-04-22 DIAGNOSIS — E785 Hyperlipidemia, unspecified: Secondary | ICD-10-CM | POA: Insufficient documentation

## 2019-04-22 NOTE — Progress Notes (Signed)
Subjective:    Patient ID: [name], female    DOB: 12-05-1965, 53 y.o.   MRN: [id number]  HPI  Very pleasant 53 yo female here for CPX and follow up of chronic medical conditions. Health Maintenance  Topic Date Due  . MAMMOGRAM  12/13/2017  . PAP SMEAR-Modifier  12/14/2019  . Fecal DNA (Cologuard)  02/18/2021  . TETANUS/TDAP  09/10/2021  . INFLUENZA VACCINE  Completed  . HIV Screening  Completed   Negative cologuard 02/18/18. Due for mammogram.  OBGYN- Dr. Christell Constant. Remote h/o ablation  Health Maintenance  Topic Date Due  . MAMMOGRAM  12/13/2017  . PAP SMEAR-Modifier  12/14/2019  . Fecal DNA (Cologuard)  02/18/2021  . TETANUS/TDAP  09/10/2021  . INFLUENZA VACCINE  Completed  . HIV Screening  Completed   Depression screen [facility] 2/9 04/25/2019 03/29/2018 03/16/2017 01/14/2016  Decreased Interest 0 0 1 0  Down, Depressed, Hopeless 0 0 1 0  PHQ - 2 Score 0 0 2 0  Altered sleeping - - 1 -  Tired, decreased energy - - 1 -  Change in appetite - - 1 -  Feeling bad or failure about yourself  - - 0 -  Trouble concentrating - - 0 -  Moving slowly or fidgety/restless - - 0 -  Suicidal thoughts - - 0 -  PHQ-9 Score - - 5 -  Difficult doing work/chores - - Not difficult at all -     Has skin cancer screening by dermatology (Dr. Terri Piedra).  Tobacco abuse- chantix made her nauseated by the 3rd week. Currently smoking about a pack a day. When I saw her last year, she did agree to Zyban.  She is willing to try patches and gums.  HTN- has been well controlled on HCTZ 12.5 mg daily. Denies any LE edema, HA, blurred vision, CP or SOB. Lab Results  Component Value Date   CREATININE 0.84 01/18/2018   BP Readings from Last 3 Encounters:  04/25/19 132/78  03/29/18 136/84  01/18/18 126/86    Wt Readings from Last 3 Encounters:  04/25/19 219 lb 3.2 oz (99.4 kg)  03/29/18 219 lb 12.8 oz (99.7 kg)  01/18/18 217 lb 3.2 oz (98.5 kg)   Lab Results  Component Value Date   TSH 0.98  01/18/2018   Lab Results  Component Value Date   ALT 8 01/18/2018   AST 18 01/18/2018   ALKPHOS 62 01/18/2018   BILITOT 0.8 01/18/2018   Lab Results  Component Value Date   NA 140 01/18/2018   K 4.2 01/18/2018   CL 102 01/18/2018   CO2 30 01/18/2018   Lab Results  Component Value Date   CHOL 218 (H) 01/18/2018   HDL 37.10 (L) 01/18/2018   LDLCALC 148 (H) 01/18/2018   TRIG 167.0 (H) 01/18/2018   CHOLHDL 6 01/18/2018   The 10-year ASCVD risk score Denman George DC Jr., et al., 2013) is: 9.7%   Values used to calculate the score:     Age: 40 years     Sex: Female     Is Non-Hispanic African American: No     Diabetic: No     Tobacco smoker: Yes     Systolic Blood Pressure: 132 mmHg     Is BP treated: Yes     HDL Cholesterol: 37.1 mg/dL     Total Cholesterol: 218 mg/dL   Lab Results  Component Value Date   VITAMINB12 417 12/28/2015    Patient Active Problem List   Diagnosis  Date Noted  . HLD (hyperlipidemia) 04/22/2019  . Well woman exam without gynecological exam 01/18/2018  . Other and unspecified ovarian cyst 10/28/2013  . Menopause 10/28/2013  . Adjustment disorder with anxious mood 12/23/2010  . Essential hypertension 03/04/2010  . DEPRESSION, SITUATIONAL 05/15/2009  . Vitamin D deficiency 09/25/2008  . Obesity 09/25/2008  . Iron deficiency anemia 09/25/2008  . TOBACCO ABUSE 08/25/2007   Past Medical History:  Diagnosis Date  . Abnormal uterine bleeding   . Anemia   . Arthritis   . Arthritis   . HTN (hypertension)   . Obesity   . Obesity   . Smoker   . Stress fracture   . SVT (supraventricular tachycardia) (HCC)   . SVT (supraventricular tachycardia) (HCC)   . Vitamin D deficiency    Past Surgical History:  Procedure Laterality Date  . APPENDECTOMY    . CESAREAN SECTION     x 2  . DERMOID CYST  EXCISION    . hydrothermal ablation     uteral and cyst removal  . HYSTEROSCOPY    . KNEE SURGERY     right x 2  . LEFT OOPHORECTOMY    . OVARIAN CYST  SURGERY     left  . SALPINGECTOMY     right; adhesions  . TUBAL LIGATION     Social History   Tobacco Use  . Smoking status: Current Every Day Smoker    Packs/day: 1.00    Years: 25.00    Pack years: 25.00    Types: Cigarettes  . Smokeless tobacco: Never Used  Substance Use Topics  . Alcohol use: No    Alcohol/week: 0.0 standard drinks  . Drug use: No   Family History  Problem Relation Age of Onset  . Diabetes Brother   . Seizures Brother   . Cancer Maternal Grandmother         gallbladder  . Stroke Paternal Grandfather   . Stroke Brother   . Colon polyps Mother   . Heart disease Father   . Diabetes Father   . Colon cancer Neg Hx    Allergies  Allergen Reactions  . Codeine   . Latex     Current Outpatient Medications:  .  Cholecalciferol (VITAMIN D) 50 MCG (2000 UT) tablet, Take 2,000 Units by mouth daily., Disp: , Rfl:  .  Omega-3 Fatty Acids (FISH OIL) 1000 MG CAPS, Take by mouth., Disp: , Rfl:  .  vitamin C (ASCORBIC ACID) 500 MG tablet, Take 500 mg by mouth daily., Disp: , Rfl:  .  buPROPion (ZYBAN) 150 MG 12 hr tablet, Take 1 tablet (150 mg total) by mouth 2 (two) times daily., Disp: 60 tablet, Rfl: 3 .  diphenhydrAMINE (BENADRYL) 25 MG tablet, Take 25 mg by mouth at bedtime as needed for itching., Disp: , Rfl:  .  hydrochlorothiazide (MICROZIDE) 12.5 MG capsule, TAKE 1 CAPSULE BY MOUTH EVERY DAY, Disp: 90 capsule, Rfl: 1 .  nicotine (NICODERM CQ) 14 mg/24hr patch, Place 1 patch (14 mg total) onto the skin daily., Disp: 28 patch, Rfl: 0   The PMH, PSH, Social History, Family History, Medications, and allergies have been reviewed in [facility], and have been updated if relevant.     Review of Systems  Constitutional: Negative for fatigue.  HENT: Negative.   Respiratory: Negative.   Cardiovascular: Negative.   Gastrointestinal: Negative.   Endocrine: Negative.   Genitourinary: Negative.   Musculoskeletal: Negative for myalgias.  Skin: Negative.    Allergic/Immunologic: Negative.  Neurological: Negative.   Hematological: Negative.   Psychiatric/Behavioral: Negative.   All other systems reviewed and are negative.     Objective:   Physical Exam BP 132/78 (BP Location: Left Arm, Patient Position: Sitting, Cuff Size: Large)   Pulse 64   Temp 98.4 F (36.9 C) (Oral)   Ht 5' 6.75" (1.695 m)   Wt 219 lb 3.2 oz (99.4 kg)   SpO2 99%   BMI 34.59 kg/m    General:  Well-developed,well-nourished,in no acute distress; alert,appropriate and cooperative throughout examination Head:  normocephalic and atraumatic.   Eyes:  vision grossly intact, PERRL Ears:  R ear normal and L ear normal externally, TMs clear bilaterally Nose:  no external deformity.   Mouth:  good dentition.   Neck:  No deformities, masses, or tenderness noted.  Lungs:  Normal respiratory effort, chest expands symmetrically. Lungs are clear to auscultation, no crackles or wheezes. Heart:  Normal rate and regular rhythm. S1 and S2 normal without gallop, murmur, click, rub or other extra sounds. Abdomen:  Bowel sounds positive,abdomen soft and non-tender without masses, organomegaly or hernias noted.is noted of thoracic or lumbar spine.   Extremities:  No clubbing, cyanosis, edema, or deformity noted with normal full range of motion of all joints.   Neurologic:  alert & oriented X3 and gait normal.   Skin:  Intact without suspicious lesions or rashes Cervical Nodes:  No lymphadenopathy noted Axillary Nodes:  No palpable lymphadenopathy Psych:  Cognition and judgment appear intact. Alert and cooperative with normal attention span and concentration. No apparent delusions, illusions, hallucinations      Assessment & Plan:

## 2019-04-25 ENCOUNTER — Encounter: Payer: Self-pay | Admitting: Family Medicine

## 2019-04-25 ENCOUNTER — Other Ambulatory Visit: Payer: Self-pay

## 2019-04-25 ENCOUNTER — Ambulatory Visit (INDEPENDENT_AMBULATORY_CARE_PROVIDER_SITE_OTHER): Payer: 59 | Admitting: Family Medicine

## 2019-04-25 VITALS — BP 132/78 | HR 64 | Temp 98.4°F | Ht 66.75 in | Wt 219.2 lb

## 2019-04-25 DIAGNOSIS — I10 Essential (primary) hypertension: Secondary | ICD-10-CM

## 2019-04-25 DIAGNOSIS — D509 Iron deficiency anemia, unspecified: Secondary | ICD-10-CM

## 2019-04-25 DIAGNOSIS — F17200 Nicotine dependence, unspecified, uncomplicated: Secondary | ICD-10-CM

## 2019-04-25 DIAGNOSIS — E785 Hyperlipidemia, unspecified: Secondary | ICD-10-CM | POA: Diagnosis not present

## 2019-04-25 DIAGNOSIS — Z0000 Encounter for general adult medical examination without abnormal findings: Secondary | ICD-10-CM | POA: Diagnosis not present

## 2019-04-25 DIAGNOSIS — F4321 Adjustment disorder with depressed mood: Secondary | ICD-10-CM

## 2019-04-25 DIAGNOSIS — F17209 Nicotine dependence, unspecified, with unspecified nicotine-induced disorders: Secondary | ICD-10-CM

## 2019-04-25 DIAGNOSIS — E559 Vitamin D deficiency, unspecified: Secondary | ICD-10-CM | POA: Diagnosis not present

## 2019-04-25 DIAGNOSIS — Z23 Encounter for immunization: Secondary | ICD-10-CM | POA: Diagnosis not present

## 2019-04-25 LAB — CBC WITH DIFFERENTIAL/PLATELET
Basophils Absolute: 0.1 10*3/uL (ref 0.0–0.1)
Basophils Relative: 0.8 % (ref 0.0–3.0)
Eosinophils Absolute: 0.2 10*3/uL (ref 0.0–0.7)
Eosinophils Relative: 3.1 % (ref 0.0–5.0)
HCT: 42.9 % (ref 36.0–46.0)
Hemoglobin: 14.5 g/dL (ref 12.0–15.0)
Lymphocytes Relative: 37.8 % (ref 12.0–46.0)
Lymphs Abs: 2.9 10*3/uL (ref 0.7–4.0)
MCHC: 33.9 g/dL (ref 30.0–36.0)
MCV: 91.7 fl (ref 78.0–100.0)
Monocytes Absolute: 0.5 10*3/uL (ref 0.1–1.0)
Monocytes Relative: 6.9 % (ref 3.0–12.0)
Neutro Abs: 4 10*3/uL (ref 1.4–7.7)
Neutrophils Relative %: 51.4 % (ref 43.0–77.0)
Platelets: 343 10*3/uL (ref 150.0–400.0)
RBC: 4.68 Mil/uL (ref 3.87–5.11)
RDW: 12.9 % (ref 11.5–15.5)
WBC: 7.7 10*3/uL (ref 4.0–10.5)

## 2019-04-25 LAB — COMPREHENSIVE METABOLIC PANEL
ALT: 8 U/L (ref 0–35)
AST: 16 U/L (ref 0–37)
Albumin: 4.5 g/dL (ref 3.5–5.2)
Alkaline Phosphatase: 54 U/L (ref 39–117)
BUN: 14 mg/dL (ref 6–23)
CO2: 30 mEq/L (ref 19–32)
Calcium: 9.8 mg/dL (ref 8.4–10.5)
Chloride: 103 mEq/L (ref 96–112)
Creatinine, Ser: 0.71 mg/dL (ref 0.40–1.20)
GFR: 86.43 mL/min (ref >60.00)
Glucose, Bld: 99 mg/dL (ref 70–99)
Potassium: 4 mEq/L (ref 3.5–5.1)
Sodium: 142 mEq/L (ref 135–145)
Total Bilirubin: 0.7 mg/dL (ref 0.2–1.2)
Total Protein: 6.9 g/dL (ref 6.0–8.3)

## 2019-04-25 LAB — LIPID PANEL
Cholesterol: 182 mg/dL (ref 0–200)
HDL: 37 mg/dL — ABNORMAL LOW (ref >39.00)
LDL Cholesterol: 119 mg/dL — ABNORMAL HIGH (ref 0–99)
NonHDL: 144.72
Total CHOL/HDL Ratio: 5
Triglycerides: 128 mg/dL (ref 0.0–149.0)
VLDL: 25.6 mg/dL (ref 0.0–40.0)

## 2019-04-25 LAB — TSH: TSH: 0.81 u[IU]/mL (ref 0.35–4.50)

## 2019-04-25 LAB — FERRITIN: Ferritin: 62.6 ng/mL (ref 10.0–291.0)

## 2019-04-25 LAB — VITAMIN D 25 HYDROXY (VIT D DEFICIENCY, FRACTURES): VITD: 47.83 ng/mL (ref 30.00–100.00)

## 2019-04-25 MED ORDER — BUPROPION HCL ER (SMOKING DET) 150 MG PO TB12: 150.0000 mg | ORAL_TABLET | Freq: Two times a day (BID) | ORAL | 3 refills | Status: DC

## 2019-04-25 MED ORDER — NICOTINE 14 MG/24HR TD PT24: 14.0000 mg | MEDICATED_PATCH | TRANSDERMAL | 0 refills | Status: DC

## 2019-04-25 MED ORDER — HYDROCHLOROTHIAZIDE 12.5 MG PO CAPS: ORAL_CAPSULE | ORAL | 1 refills | Status: DC

## 2019-04-25 NOTE — Assessment & Plan Note (Signed)
Well controlled on low dose HCTZ. Check labs today.

## 2019-04-25 NOTE — Assessment & Plan Note (Signed)
Doing well 

## 2019-04-25 NOTE — Assessment & Plan Note (Signed)
Check labs today.

## 2019-04-25 NOTE — Assessment & Plan Note (Addendum)
Smoking cessation instruction/counseling given:  commended patient for quitting and reviewed strategies for preventing relapses.  She agrees to referral for counseling.  She also agrees to zyban and nicoderm CQ.  She will pick a quit date.  Orders Placed This Encounter  Procedures  . Lipid Profile  . Vitamin D (25 hydroxy)  . CBC with Differential/Platelet  . Comprehensive metabolic panel  . TSH  . Ferritin  . Ambulatory referral to Smoking Cessation Program

## 2019-04-25 NOTE — Assessment & Plan Note (Signed)
Reviewed preventive care protocols, scheduled due services, and updated immunizations Discussed nutrition, exercise, diet, and healthy lifestyle.  Flu vaccine given today.  She will call OBGYN from mammogram and exam.

## 2019-04-25 NOTE — Assessment & Plan Note (Signed)
Labs today. Orders Placed This Encounter  Procedures  . Lipid Profile  . Vitamin D (25 hydroxy)  . CBC with Differential/Platelet  . Comprehensive metabolic panel  . TSH  . Ferritin  . Ambulatory referral to Smoking Cessation Program

## 2019-04-25 NOTE — Patient Instructions (Addendum)
Great to see you. I will call you with your lab results from today and you can view them online.   Please call Dr.Moore to schedule your pap smear and mammogram.  Happy birthday and congratulations!! Pick and quit date!!!  Supraventricular Tachycardia, Adult Supraventricular tachycardia (SVT) is a kind of abnormal heartbeat. It makes your heart beat very fast and then beat at a normal speed. A normal resting heartbeat is 60-100 times a minute. This condition can make your heart beat more than 150 times a minute. Times of having a fast heartbeat (episodes) can be scary, but they are usually not dangerous. In some cases, they may lead to heart failure if:  They happen many times per day.  Last longer than a few seconds. What are the causes?  A normal heartbeat starts when an area called the sinoatrial node sends out an electrical signal. In SVT, other areas of the heart send out signals that get in the way of the signal from the sinoatrial node. What increases the risk? You are more likely to develop this condition if you are:  5012-52 years old.  A woman. The following factors may make you more likely to develop this condition:  Stress.  Tiredness.  Smoking.  Stimulant drugs, such as cocaine and methamphetamine.  Alcohol.  Caffeine.  Pregnancy.  Feeling worried or nervous (anxiety). What are the signs or symptoms?  A pounding heart.  A feeling that your heart is skipping beats (palpitations).  Weakness.  Trouble getting enough air.  Pain or tightness in your chest.  Feeling like you are going to pass out (faint).  Feeling worried or nervous.  Dizziness.  Sweating.  Feeling sick to your stomach (nausea).  Passing out.  Tiredness. Sometimes, there are no symptoms. How is this treated?  Vagal nerve stimulation. Ways to do this include: ? Holding your breath and pushing, as though you are pooping (having a bowel movement). ? Massaging an area on one  side of your neck. Do not try this yourself. Only a doctor should do this. If done the wrong way, it can lead to a stroke. ? Bending forward with your head between your legs. ? Coughing while bending forward with your head between your legs. ? Closing your eyes and massaging your eyeballs. Ask a doctor how to do this.  Medicines that prevent attacks.  Medicine to stop an attack given through an IV tube at the hospital.  A small electric shock (cardioversion) that stops an attack.  Radiofrequency ablation. In this procedure, a small, thin tube (catheter) is used to send energy to the area that is causing the rapid heartbeats. If you do not have symptoms, you may not need treatment. Follow these instructions at home: Stress  Avoid things that make you feel stressed.  To deal with stress, try: ? Doing yoga or meditation, or being out in nature. ? Listening to relaxing music. ? Doing deep breathing. ? Taking steps to be healthy, such as getting lots of sleep, exercising, and eating a balanced diet. ? Talking with a mental health doctor. Lifestyle   Try to get at least 7 hours of sleep each night.  Do not use any products that contain nicotine or tobacco, such as cigarettes, e-cigarettes, and chewing tobacco. If you need help quitting, ask your doctor.  Be aware of how alcohol affects you. ? If alcohol gives you a fast heartbeat, do not drink alcohol. ? If alcohol does not seem to give you a fast heartbeat,  limit alcohol use to no more than 1 drink a day for women who are not pregnant, and 2 drinks a day for men. In the [place] one drink is one of these: ? 12 oz of beer (355 mL). ? 5 oz of wine (148 mL). ? 1 oz of hard liquor (44 mL).  Be aware of how caffeine affects you. ? If caffeine gives you a fast heartbeat, do not eat, drink, or use anything with caffeine in it. ? If caffeine does not seem to give you a fast heartbeat, limit how much caffeine you eat, drink, or use.  Do not  use stimulant drugs. If you need help quitting, ask your doctor. General instructions  Stay at a healthy weight.  Exercise regularly. Ask your doctor about good activities for you. Try one or a mixture of these: ? 150 minutes a week of gentle exercise, like walking or yoga. ? 75 minutes a week of exercise that is very active, like running or swimming.  Do vagus nerve treatments to slow down your heartbeat as told by your doctor.  Take over-the-counter and prescription medicines only as told by your doctor.  Keep all follow-up visits as told by your doctor. This is important. Contact a doctor if:  You have a fast heartbeat more often.  Times of having a fast heartbeat last longer than before.  Home treatments to slow down your heartbeat do not help.  You have new symptoms. Get help right away if:  You have chest pain.  Your symptoms get worse.  You have trouble breathing.  Your heart beats very fast for more than 20 minutes.  You pass out. These symptoms may be an emergency. Do not wait to see if the symptoms will go away. Get medical help right away. Call your local emergency services (911 in the [place] Do not drive yourself to the hospital. Summary  SVT is a type of abnormal heart beat.  This condition can make your heart beat more than 150 times a minute.  Treatment depends on how often the condition happens and your symptoms. This information is not intended to replace advice given to you by your health care provider. Make sure you discuss any questions you have with your health care provider. Document Released: 07/28/2005 Document Revised: 06/15/2018 Document Reviewed: 06/15/2018 Elsevier Patient Education  2020 [employer].

## 2019-07-19 LAB — HM PAP SMEAR: HM Pap smear: NEGATIVE

## 2019-07-19 LAB — HM MAMMOGRAPHY

## 2019-11-24 ENCOUNTER — Other Ambulatory Visit: Payer: Self-pay

## 2019-11-25 ENCOUNTER — Ambulatory Visit: Payer: 59 | Admitting: Family Medicine

## 2019-11-25 ENCOUNTER — Encounter: Payer: Self-pay | Admitting: Family Medicine

## 2019-11-25 VITALS — BP 122/62 | HR 96 | Temp 97.8°F | Ht 66.75 in | Wt 223.0 lb

## 2019-11-25 DIAGNOSIS — F17200 Nicotine dependence, unspecified, uncomplicated: Secondary | ICD-10-CM | POA: Diagnosis not present

## 2019-11-25 DIAGNOSIS — E785 Hyperlipidemia, unspecified: Secondary | ICD-10-CM | POA: Diagnosis not present

## 2019-11-25 DIAGNOSIS — E669 Obesity, unspecified: Secondary | ICD-10-CM

## 2019-11-25 DIAGNOSIS — Z7689 Persons encountering health services in other specified circumstances: Secondary | ICD-10-CM

## 2019-11-25 DIAGNOSIS — I10 Essential (primary) hypertension: Secondary | ICD-10-CM | POA: Diagnosis not present

## 2019-11-25 NOTE — Patient Instructions (Signed)
Good to meet you today  A resource that I like is www.dietdoctor.com/diabetes/diet  Here are some guidelines to help you with meal planning -  Avoid all processed and packaged foods (bread, pasta, crackers, chips, etc) and beverages containing calories.  Avoid added sugars and excessive natural sugars.  Attention to how you feel if you consume artificial sweeteners.  Do they make you more hungry or raise your blood sugar?  With every meal and snack, aim to get 20 g of protein (3 ounces of meat, 4 ounces of fish, 3 eggs, protein powder, 1 cup [place] yogurt, 1 cup cottage cheese, etc.)  Increase fiber in the form of non-starchy vegetables.  These help you feel full with very little carbohydrates and are good for gut health.  Eat 1 serving healthy carb per meal- 1/2 cup brown rice, beans, potato, corn- pay attention to whether or not this significantly raises your blood sugar. If it does, reduce the frequency you consume these.   Eat 2-3 servings of lower sugar fruits daily.  This includes berries, apples, oranges, peaches, pears, one half banana.  Have small amounts of good fats such as avocado, nuts, olive oil, nut butters, olives.  Add a little cheese to your salads to make them tasty.

## 2019-11-25 NOTE — Progress Notes (Signed)
   Subjective:    Patient ID: [name], female    DOB: [birth year], 54 y.o.   MRN: [id number]  HPI Chief Complaint  Patient presents with  . New Patient (Initial Visit)   This is a 54 yo female who presents today to establish care. 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 Lives with her husband. Grown children.   Last CPE- 04/25/19 [facility]- [facility] 12/[facility] Pap- up to date Colonoscopy- 2019 cologuard Tdap- unsure Flu- annual Eye- 2 years, due Dental- regular Exercise- not regular     Review of Systems Denies chest pain, SOB, abdominal pain, diarrhea/ constipation, leg swelling    Objective:   Physical Exam Vitals reviewed.  Constitutional:      General: She is not in acute distress.    Appearance: Normal appearance. She is obese. She is not ill-appearing, toxic-appearing or diaphoretic.  HENT:     Head: Normocephalic and atraumatic.  Eyes:     Conjunctiva/sclera: Conjunctivae normal.  Cardiovascular:     Rate and Rhythm: Normal rate and regular rhythm.     Heart sounds: Normal heart sounds.  Pulmonary:     Effort: Pulmonary effort is normal.     Breath sounds: Normal breath sounds.  Musculoskeletal:     Right lower leg: No edema.     Left lower leg: No edema.  Skin:    General: Skin is warm and dry.  Neurological:     Mental Status: She is alert and oriented to person, place, and time.  Psychiatric:        Mood and Affect: Mood normal.        Behavior: Behavior normal.        Thought Content: Thought content normal.        Judgment: Judgment normal.       BP 122/62 (BP Location: Left Arm, Patient Position: Sitting, Cuff Size: Normal)   Pulse 96   Temp 97.8 F (36.6 C) (Tympanic)   Ht 5' 6.75" (1.695 m)   Wt 223 lb (101.2 kg)   SpO2 98%   BMI 35.19 kg/m  Wt Readings from Last 3 Encounters:  11/25/19 223 lb (101.2 kg)  04/25/19 219 lb 3.2 oz (99.4 kg)  03/29/18 219 lb 12.8 oz (99.7 kg)       Assessment & Plan:  1.  Encounter to establish care - available records reviewed, will request pap/ mammogram from gyn  2. Essential hypertension - well controlled  3. Hyperlipidemia, unspecified hyperlipidemia type - labs checked 9/20, will recheck at follow up, discussed weight loss  4. TOBACCO ABUSE - not interested in quitting at this time  5. Class 2 obesity with body mass index (BMI) of 35 to 39.9 without comorbidity - encouraged her to work on meal planning, decrease sugars/ starches, fast and processed foods.   - follow up in 6 months for labs/ physical  This visit occurred during the SARS-CoV-2 public health emergency.  Safety protocols were in place, including screening questions prior to the visit, additional usage of staff PPE, and extensive cleaning of exam room while observing appropriate contact time as indicated for disinfecting solutions.      Olean Ree, FNP-BC  [facility], [facility]  11/29/2019 2:30 PM

## 2019-11-29 ENCOUNTER — Encounter: Payer: Self-pay | Admitting: Family Medicine

## 2019-12-16 ENCOUNTER — Encounter: Payer: Self-pay | Admitting: Family Medicine

## 2020-02-28 ENCOUNTER — Other Ambulatory Visit: Payer: Self-pay | Admitting: *Deleted

## 2020-02-28 MED ORDER — HYDROCHLOROTHIAZIDE 12.5 MG PO CAPS: ORAL_CAPSULE | ORAL | 0 refills | Status: DC

## 2020-03-29 ENCOUNTER — Other Ambulatory Visit: Payer: Self-pay | Admitting: Family Medicine

## 2020-03-29 NOTE — Telephone Encounter (Signed)
HCTZ refilled 02/28/2020 for #90 with no refills.  Should not need refill until October.

## 2020-03-29 NOTE — Telephone Encounter (Signed)
Patient called in stating that they have scheduled for cpe. Patient is wanting to make sure refills can be filled till her appointment in October. Please advise.

## 2020-05-27 ENCOUNTER — Other Ambulatory Visit: Payer: Self-pay | Admitting: Family Medicine

## 2020-06-07 ENCOUNTER — Other Ambulatory Visit: Payer: Self-pay | Admitting: Family Medicine

## 2020-06-07 DIAGNOSIS — I10 Essential (primary) hypertension: Secondary | ICD-10-CM

## 2020-06-07 DIAGNOSIS — E559 Vitamin D deficiency, unspecified: Secondary | ICD-10-CM

## 2020-06-07 DIAGNOSIS — E669 Obesity, unspecified: Secondary | ICD-10-CM

## 2020-06-07 DIAGNOSIS — E785 Hyperlipidemia, unspecified: Secondary | ICD-10-CM

## 2020-06-08 ENCOUNTER — Encounter: Payer: Self-pay | Admitting: Family Medicine

## 2020-06-08 ENCOUNTER — Other Ambulatory Visit: Payer: Self-pay

## 2020-06-08 ENCOUNTER — Ambulatory Visit (INDEPENDENT_AMBULATORY_CARE_PROVIDER_SITE_OTHER): Payer: 59 | Admitting: Family Medicine

## 2020-06-08 VITALS — BP 122/82 | HR 96 | Temp 98.1°F | Ht 66.0 in | Wt 190.0 lb

## 2020-06-08 DIAGNOSIS — Z23 Encounter for immunization: Secondary | ICD-10-CM

## 2020-06-08 DIAGNOSIS — Z0000 Encounter for general adult medical examination without abnormal findings: Secondary | ICD-10-CM | POA: Diagnosis not present

## 2020-06-08 DIAGNOSIS — E6609 Other obesity due to excess calories: Secondary | ICD-10-CM

## 2020-06-08 DIAGNOSIS — Z6830 Body mass index (BMI) 30.0-30.9, adult: Secondary | ICD-10-CM

## 2020-06-08 DIAGNOSIS — E538 Deficiency of other specified B group vitamins: Secondary | ICD-10-CM | POA: Diagnosis not present

## 2020-06-08 DIAGNOSIS — E785 Hyperlipidemia, unspecified: Secondary | ICD-10-CM | POA: Diagnosis not present

## 2020-06-08 DIAGNOSIS — I10 Essential (primary) hypertension: Secondary | ICD-10-CM

## 2020-06-08 DIAGNOSIS — E559 Vitamin D deficiency, unspecified: Secondary | ICD-10-CM | POA: Diagnosis not present

## 2020-06-08 DIAGNOSIS — F17200 Nicotine dependence, unspecified, uncomplicated: Secondary | ICD-10-CM

## 2020-06-08 DIAGNOSIS — R634 Abnormal weight loss: Secondary | ICD-10-CM

## 2020-06-08 LAB — CBC WITH DIFFERENTIAL/PLATELET
Basophils Absolute: 0.1 10*3/uL (ref 0.0–0.1)
Basophils Relative: 1.1 % (ref 0.0–3.0)
Eosinophils Absolute: 0.2 10*3/uL (ref 0.0–0.7)
Eosinophils Relative: 1.8 % (ref 0.0–5.0)
HCT: 45.2 % (ref 36.0–46.0)
Hemoglobin: 15.3 g/dL — ABNORMAL HIGH (ref 12.0–15.0)
Lymphocytes Relative: 34 % (ref 12.0–46.0)
Lymphs Abs: 3 10*3/uL (ref 0.7–4.0)
MCHC: 33.9 g/dL (ref 30.0–36.0)
MCV: 91.8 fl (ref 78.0–100.0)
Monocytes Absolute: 0.6 10*3/uL (ref 0.1–1.0)
Monocytes Relative: 6.9 % (ref 3.0–12.0)
Neutro Abs: 4.9 10*3/uL (ref 1.4–7.7)
Neutrophils Relative %: 56.2 % (ref 43.0–77.0)
Platelets: 323 10*3/uL (ref 150.0–400.0)
RBC: 4.92 Mil/uL (ref 3.87–5.11)
RDW: 13.2 % (ref 11.5–15.5)
WBC: 8.7 10*3/uL (ref 4.0–10.5)

## 2020-06-08 LAB — COMPREHENSIVE METABOLIC PANEL
ALT: 6 U/L (ref 0–35)
AST: 12 U/L (ref 0–37)
Albumin: 4.6 g/dL (ref 3.5–5.2)
Alkaline Phosphatase: 59 U/L (ref 39–117)
BUN: 10 mg/dL (ref 6–23)
CO2: 30 mEq/L (ref 19–32)
Calcium: 10.2 mg/dL (ref 8.4–10.5)
Chloride: 103 mEq/L (ref 96–112)
Creatinine, Ser: 0.79 mg/dL (ref 0.40–1.20)
GFR: 85.54 mL/min (ref >60.00)
Glucose, Bld: 88 mg/dL (ref 70–99)
Potassium: 4.1 mEq/L (ref 3.5–5.1)
Sodium: 140 mEq/L (ref 135–145)
Total Bilirubin: 0.6 mg/dL (ref 0.2–1.2)
Total Protein: 7.1 g/dL (ref 6.0–8.3)

## 2020-06-08 LAB — LIPID PANEL
Cholesterol: 187 mg/dL (ref 0–200)
HDL: 37.6 mg/dL — ABNORMAL LOW (ref >39.00)
LDL Cholesterol: 122 mg/dL — ABNORMAL HIGH (ref 0–99)
NonHDL: 149.18
Total CHOL/HDL Ratio: 5
Triglycerides: 136 mg/dL (ref 0.0–149.0)
VLDL: 27.2 mg/dL (ref 0.0–40.0)

## 2020-06-08 LAB — VITAMIN D 25 HYDROXY (VIT D DEFICIENCY, FRACTURES): VITD: 58.11 ng/mL (ref 30.00–100.00)

## 2020-06-08 LAB — HEMOGLOBIN A1C: Hgb A1c MFr Bld: 5.9 % (ref 4.6–6.5)

## 2020-06-08 LAB — VITAMIN B12: Vitamin B-12: 202 pg/mL — ABNORMAL LOW (ref 211–911)

## 2020-06-08 LAB — TSH: TSH: 0.64 u[IU]/mL (ref 0.35–4.50)

## 2020-06-08 NOTE — Patient Instructions (Addendum)
Good to see you today  Please consider stopping smoking as soon as possible, see information below, can consider medication, nicotine replacement   Coping with Quitting Smoking  Quitting smoking is a physical and mental challenge. You will face cravings, withdrawal symptoms, and temptation. Before quitting, work with your health care provider to make a plan that can help you cope. Preparation can help you quit and keep you from giving in. How can I cope with cravings? Cravings usually last for 5-10 minutes. If you get through it, the craving will pass. Consider taking the following actions to help you cope with cravings:  Keep your mouth busy: ? Chew sugar-free gum. ? Suck on hard candies or a straw. ? Brush your teeth.  Keep your hands and body busy: ? Immediately change to a different activity when you feel a craving. ? Squeeze or play with a ball. ? Do an activity or a hobby, like making bead jewelry, practicing needlepoint, or working with wood. ? Mix up your normal routine. ? Take a short exercise break. Go for a quick walk or run up and down stairs. ? Spend time in public places where smoking is not allowed.  Focus on doing something kind or helpful for someone else.  Call a friend or family member to talk during a craving.  Join a support group.  Call a quit line, such as 1-800-QUIT-NOW.  Talk with your health care provider about medicines that might help you cope with cravings and make quitting easier for you. How can I deal with withdrawal symptoms? Your body may experience negative effects as it tries to get used to not having nicotine in the system. These effects are called withdrawal symptoms. They may include:  Feeling hungrier than normal.  Trouble concentrating.  Irritability.  Trouble sleeping.  Feeling depressed.  Restlessness and agitation.  Craving a cigarette. To manage withdrawal symptoms:  Avoid places, people, and activities that trigger your  cravings.  Remember why you want to quit.  Get plenty of sleep.  Avoid coffee and other caffeinated drinks. These may worsen some of your symptoms. How can I handle social situations? Social situations can be difficult when you are quitting smoking, especially in the first few weeks. To manage this, you can:  Avoid parties, bars, and other social situations where people might be smoking.  Avoid alcohol.  Leave right away if you have the urge to smoke.  Explain to your family and friends that you are quitting smoking. Ask for understanding and support.  Plan activities with friends or family where smoking is not an option. What are some ways I can cope with stress? Wanting to smoke may cause stress, and stress can make you want to smoke. Find ways to manage your stress. Relaxation techniques can help. For example:  Breathe slowly and deeply, in through your nose and out through your mouth.  Listen to soothing, relaxing music.  Talk with a family member or friend about your stress.  Light a candle.  Soak in a bath or take a shower.  Think about a peaceful place. What are some ways I can prevent weight gain? Be aware that many people gain weight after they quit smoking. However, not everyone does. To keep from gaining weight, have a plan in place before you quit and stick to the plan after you quit. Your plan should include:  Having healthy snacks. When you have a craving, it may help to: ? Eat plain popcorn, crunchy carrots, celery, or  other cut vegetables. ? Chew sugar-free gum.  Changing how you eat: ? Eat small portion sizes at meals. ? Eat 4-6 small meals throughout the day instead of 1-2 large meals a day. ? Be mindful when you eat. Do not watch television or do other things that might distract you as you eat.  Exercising regularly: ? Make time to exercise each day. If you do not have time for a long workout, do short bouts of exercise for 5-10 minutes several times a  day. ? Do some form of strengthening exercise, like weight lifting, and some form of aerobic exercise, like running or swimming.  Drinking plenty of water or other low-calorie or no-calorie drinks. Drink 6-8 glasses of water daily, or as much as instructed by your health care provider. Summary  Quitting smoking is a physical and mental challenge. You will face cravings, withdrawal symptoms, and temptation to smoke again. Preparation can help you as you go through these challenges.  You can cope with cravings by keeping your mouth busy (such as by chewing gum), keeping your body and hands busy, and making calls to family, friends, or a helpline for people who want to quit smoking.  You can cope with withdrawal symptoms by avoiding places where people smoke, avoiding drinks with caffeine, and getting plenty of rest.  Ask your health care provider about the different ways to prevent weight gain, avoid stress, and handle social situations. This information is not intended to replace advice given to you by your health care provider. Make sure you discuss any questions you have with your health care provider. Document Revised: 07/10/2017 Document Reviewed: 07/25/2016 Elsevier Patient Education  2020 [employer].

## 2020-06-08 NOTE — Progress Notes (Signed)
Subjective:    Patient ID: [name], female    DOB: Jan 14, 1966, 54 y.o.   MRN: [id number]  HPI This is a 54 yo female who presents today for annual exam. Husband also having annual exam today an accompanies her in same room.   Last CPE- 04/25/2019 Mammo-07/19/2019 Pap-gyn Colonoscopy-  02/18/2018 Tdap- unknown Flu-annual Covid 19 vaccine- not vaccinated Eye- has upcoming appointment Dental- regular Exercise- not regular Smoking- 1 ppd, not in a mental place to quit  Weight loss- increased stress, father passed away in February 07, 2023 unexpectedly.   Review of Systems  Constitutional: Positive for appetite change (decreased).  HENT: Negative.   Eyes: Negative.   Respiratory: Negative.   Cardiovascular: Negative.   Gastrointestinal: Positive for abdominal pain (some tenderness from C section incisions). Negative for blood in stool, constipation and diarrhea.  Endocrine: Negative.   Genitourinary: Negative.   Musculoskeletal: Negative.   Skin: Negative.   Allergic/Immunologic: Negative.   Neurological: Negative.   Hematological: Negative.   Psychiatric/Behavioral: Positive for sleep disturbance.       Objective:   Physical Exam Physical Exam  Constitutional: She is oriented to person, place, and time. She appears well-developed and well-nourished. No distress.  HENT:  Head: Normocephalic and atraumatic.  Right Ear: External ear normal. TM normal.  Left Ear: External ear normal. TM normal.  Nose: Nose normal.  Mouth/Throat: Oropharynx is clear and moist. No oropharyngeal exudate.  Eyes: Conjunctivae are normal.   Neck: Normal range of motion. Neck supple. No JVD present. No thyromegaly present.  Cardiovascular: Normal rate, regular rhythm, normal heart sounds and intact distal pulses.   Pulmonary/Chest: Effort normal and breath sounds normal. Right breast exhibits no inverted nipple, no mass, no nipple discharge, no skin change and no tenderness. Left breast exhibits no  inverted nipple, no mass, no nipple discharge, no skin change and no tenderness. Breasts are symmetrical.  Musculoskeletal: Normal range of motion. She exhibits no edema or tenderness.  Lymphadenopathy:    She has no cervical adenopathy.  Neurological: She is alert and oriented to person, place, and time.   Skin: Skin is warm and dry. She is not diaphoretic.  Psychiatric: She has a normal mood and affect. Her behavior is normal. Judgment and thought content normal.  Vitals reviewed.     BP 122/82   Pulse 96   Temp 98.1 F (36.7 C) (Temporal)   Ht 5\' 6"  (1.676 m)   Wt 190 lb (86.2 kg)   SpO2 96%   BMI 30.67 kg/m  Wt Readings from Last 3 Encounters:  06/08/20 190 lb (86.2 kg)  11/25/19 223 lb (101.2 kg)  04/25/19 219 lb 3.2 oz (99.4 kg)   Depression screen [facility] 2/9 06/08/2020 04/25/2019 03/29/2018 03/16/2017 01/14/2016  Decreased Interest 0 0 0 1 0  Down, Depressed, Hopeless 0 0 0 1 0  PHQ - 2 Score 0 0 0 2 0  Altered sleeping - - - 1 -  Tired, decreased energy - - - 1 -  Change in appetite - - - 1 -  Feeling bad or failure about yourself  - - - 0 -  Trouble concentrating - - - 0 -  Moving slowly or fidgety/restless - - - 0 -  Suicidal thoughts - - - 0 -  PHQ-9 Score - - - 5 -  Difficult doing work/chores - - - Not difficult at all -       Assessment & Plan:  1. Annual physical exam  - Discussed  and encouraged healthy lifestyle choices- adequate sleep, regular exercise, stress management and healthy food choices.    2. Class 1 obesity due to excess calories with serious comorbidity and body mass index (BMI) of 30.0 to 30.9 in adult - Hemoglobin A1c - Lipid panel  3. Essential hypertension - Lipid panel - Comprehensive metabolic panel - CBC with Differential/Platelet  4. Hyperlipidemia, unspecified hyperlipidemia type - Lipid panel - Comprehensive metabolic panel - CBC with Differential/Platelet  5. Vitamin D deficiency - VITAMIN D 25 Hydroxy (Vit-D Deficiency,  Fractures)  6. Weight loss - TSH  7. Low serum vitamin B12 - Vitamin B12  8. Need for influenza vaccination - Flu Vaccine QUAD 6+ mos PF IM (Fluarix Quad PF)  9.  Tobacco abuse -Discussed cessation, provided written and verbal information regarding medication, nicotine replacement, weaning  This visit occurred during the SARS-CoV-2 public health emergency.  Safety protocols were in place, including screening questions prior to the visit, additional usage of staff PPE, and extensive cleaning of exam room while observing appropriate contact time as indicated for disinfecting solutions.    Olean Ree, FNP-BC  [facility], [facility]  06/08/2020 6:00 PM

## 2020-06-13 ENCOUNTER — Telehealth: Payer: Self-pay

## 2020-06-13 NOTE — Telephone Encounter (Signed)
Pt left v/m requesting cb about recent lab results (06/08/20).

## 2020-06-13 NOTE — Telephone Encounter (Signed)
Please call patient and see if she received mychart message about her labs?

## 2020-06-15 NOTE — Telephone Encounter (Signed)
Advised pt of results and supplement. Pt reports she did see the message on my chart. Pt verbalized understanding.

## 2020-09-07 ENCOUNTER — Other Ambulatory Visit: Payer: Self-pay | Admitting: Family Medicine

## 2020-09-07 NOTE — Addendum Note (Signed)
Addended by: Sherrie George on: 09/07/2020 04:56 PM   Modules accepted: Orders

## 2020-09-07 NOTE — Telephone Encounter (Signed)
Patient scheduled appointment with Dr.Cody on 11/06/20.  Patient is out of medication.

## 2020-09-07 NOTE — Telephone Encounter (Signed)
Patient states that [facility] [address] in [place] has been trying to send a request for the following medication and they are getting a error message when trying to send it.  * Hydrochlorothiazide (Mircrozide) *

## 2020-09-07 NOTE — Telephone Encounter (Addendum)
Pharmacy requests refill on: HCTZ  LAST REFILL: 05/28/20, #30, 2RF LAST OV:06/08/20 NEXT OV: TOC with Dr. Selena Batten on 11/06/20 PHARMACY:  [facility] [address]

## 2020-09-08 MED ORDER — HYDROCHLOROTHIAZIDE 12.5 MG PO CAPS
12.5000 mg | ORAL_CAPSULE | Freq: Every day | ORAL | 2 refills | Status: DC
Start: 2020-09-08 — End: 2020-11-06

## 2020-09-08 NOTE — Addendum Note (Signed)
Addended by: Lynnda Child on: 09/08/2020 09:07 AM   Modules accepted: Orders

## 2020-11-06 ENCOUNTER — Other Ambulatory Visit: Payer: Self-pay

## 2020-11-06 ENCOUNTER — Encounter: Payer: Self-pay | Admitting: Family Medicine

## 2020-11-06 ENCOUNTER — Ambulatory Visit: Payer: 59 | Admitting: Family Medicine

## 2020-11-06 VITALS — BP 110/70 | HR 69 | Temp 97.9°F | Ht 66.0 in | Wt 187.8 lb

## 2020-11-06 DIAGNOSIS — E785 Hyperlipidemia, unspecified: Secondary | ICD-10-CM

## 2020-11-06 DIAGNOSIS — R350 Frequency of micturition: Secondary | ICD-10-CM

## 2020-11-06 DIAGNOSIS — I10 Essential (primary) hypertension: Secondary | ICD-10-CM | POA: Diagnosis not present

## 2020-11-06 DIAGNOSIS — F17200 Nicotine dependence, unspecified, uncomplicated: Secondary | ICD-10-CM

## 2020-11-06 DIAGNOSIS — R7303 Prediabetes: Secondary | ICD-10-CM | POA: Insufficient documentation

## 2020-11-06 LAB — POC URINALSYSI DIPSTICK (AUTOMATED)
Bilirubin, UA: NEGATIVE
Blood, UA: NEGATIVE
Glucose, UA: NEGATIVE
Ketones, UA: NEGATIVE
Leukocytes, UA: NEGATIVE
Nitrite, UA: NEGATIVE
Protein, UA: NEGATIVE
Spec Grav, UA: >=1.03 — AB (ref 1.010–1.025)
Urobilinogen, UA: 0.2 E.U./dL
pH, UA: 5 (ref 5.0–8.0)

## 2020-11-06 MED ORDER — HYDROCHLOROTHIAZIDE 12.5 MG PO CAPS: 12.5000 mg | ORAL_CAPSULE | Freq: Every day | ORAL | 1 refills | Status: DC

## 2020-11-06 NOTE — Assessment & Plan Note (Signed)
Encouraged cessation. Not currently ready to quit.

## 2020-11-06 NOTE — Patient Instructions (Signed)
Ok to try stopping Hydrochlorothiazide   Send Mychart message with readings 2 weeks after stopping medication   Please check your blood pressure 2-4 times a week.   To check your blood pressure 1) Sit in a quiet and relaxed place for 5 minutes 2) Make sure your feet are flat on the ground 3) Consider checking first thing in the morning   Normal blood pressure is less than 140/90 Ideally you blood pressure should be around 120/80  Other ways you can reduce your blood pressure:  1) Regular exercise -- Try to get 150 minutes (30 minutes, 5 days a week) of moderate to vigorous aerobic excercise -- Examples: brisk walking (2.5 miles per hour), water aerobics, dancing, gardening, tennis, biking slower than 10 miles per hour 2) DASH Diet - low fat meats, more fresh fruits and vegetables, whole grains, low salt 3) Quit smoking if you smoke 4) Loose 5-10% of your body weight

## 2020-11-06 NOTE — Assessment & Plan Note (Signed)
BP with excellent control. Discussed trial of stopping with home monitoring. Pt will message with home readings 2 weeks after stopping HCTZ 12.5 mg

## 2020-11-06 NOTE — Assessment & Plan Note (Signed)
Encouraged healthy diet and exercise. Recheck in the fall.

## 2020-11-06 NOTE — Progress Notes (Signed)
Subjective:     [name] is a 55 y.o. female presenting for Transitions Of Care and Urinary Frequency (With on and off pain x 2 days )     HPI  #Urinary frequency - started 2-3 days ago - no burning or irritation - no urgency - no pressure or pain - no n/v - no back pain - drinks 4 cups of water day - pepsi is her main caffeine - 3 cans per day, no coffee - has gone 5 times this morning - no constipation   Review of Systems   Social History   Tobacco Use  Smoking Status Current Every Day Smoker  . Packs/day: 1.00  . Years: 25.00  . Pack years: 25.00  . Types: Cigarettes  Smokeless Tobacco Never Used        Objective:    BP Readings from Last 3 Encounters:  11/06/20 110/70  06/08/20 122/82  11/25/19 122/62   Wt Readings from Last 3 Encounters:  11/06/20 187 lb 12 oz (85.2 kg)  06/08/20 190 lb (86.2 kg)  11/25/19 223 lb (101.2 kg)    BP 110/70   Pulse 69   Temp 97.9 F (36.6 C) (Temporal)   Ht 5\' 6"  (1.676 m)   Wt 187 lb 12 oz (85.2 kg)   SpO2 99%   BMI 30.30 kg/m    Physical Exam Constitutional:      General: She is not in acute distress.    Appearance: She is well-developed. She is not diaphoretic.  HENT:     Right Ear: External ear normal.     Left Ear: External ear normal.  Eyes:     Conjunctiva/sclera: Conjunctivae normal.  Cardiovascular:     Rate and Rhythm: Normal rate and regular rhythm.     Heart sounds: No murmur heard.   Pulmonary:     Effort: Pulmonary effort is normal. No respiratory distress.     Breath sounds: Normal breath sounds. No wheezing.  Musculoskeletal:     Cervical back: Neck supple.  Skin:    General: Skin is warm and dry.     Capillary Refill: Capillary refill takes less than 2 seconds.  Neurological:     Mental Status: She is alert. Mental status is at baseline.  Psychiatric:        Mood and Affect: Mood normal.        Behavior: Behavior normal.      The 10-year ASCVD risk score DC  Jr., et al., 2013) is: 5.8%   Values used to calculate the score:     Age: 3 years     Sex: Female     Is Non-Hispanic African American: No     Diabetic: No     Tobacco smoker: Yes     Systolic Blood Pressure: 110 mmHg     Is BP treated: Yes     HDL Cholesterol: 37.6 mg/dL     Total Cholesterol: 187 mg/dL  UA: neg nitrites, neg LE    Assessment & Plan:   Problem List Items Addressed This Visit      Cardiovascular and Mediastinum   Essential hypertension    BP with excellent control. Discussed trial of stopping with home monitoring. Pt will message with home readings 2 weeks after stopping HCTZ 12.5 mg      Relevant Medications   hydrochlorothiazide (MICROZIDE) 12.5 MG capsule     Other   TOBACCO ABUSE    Encouraged cessation. Not currently ready to quit.  HLD (hyperlipidemia)    Discussed ASCVD risk of 5.8% and that smoking cessation would help. No need for medication at this time. Cont healthy diet. Try to exercise.       Relevant Medications   hydrochlorothiazide (MICROZIDE) 12.5 MG capsule   Prediabetes    Encouraged healthy diet and exercise. Recheck in the fall.        Other Visit Diagnoses    Urinary frequency    -  Primary   Relevant Orders   POCT Urinalysis Dipstick (Automated) (Completed)     UA w/o infection. Advised hydration and limit caffeine and carbonation until symptoms improve. Call back if not resolving and consider urology evaluation   Return in about 7 months (around 06/08/2021).  Lynnda Child, MD  This visit occurred during the SARS-CoV-2 public health emergency.  Safety protocols were in place, including screening questions prior to the visit, additional usage of staff PPE, and extensive cleaning of exam room while observing appropriate contact time as indicated for disinfecting solutions.

## 2020-11-06 NOTE — Assessment & Plan Note (Signed)
Discussed ASCVD risk of 5.8% and that smoking cessation would help. No need for medication at this time. Cont healthy diet. Try to exercise.

## 2021-05-01 ENCOUNTER — Other Ambulatory Visit: Payer: Self-pay

## 2021-05-01 ENCOUNTER — Telehealth (INDEPENDENT_AMBULATORY_CARE_PROVIDER_SITE_OTHER): Payer: 59 | Admitting: Nurse Practitioner

## 2021-05-01 ENCOUNTER — Encounter: Payer: Self-pay | Admitting: Nurse Practitioner

## 2021-05-01 VITALS — BP 129/76 | HR 75 | Temp 99.7°F | Wt 190.0 lb

## 2021-05-01 DIAGNOSIS — U071 COVID-19: Secondary | ICD-10-CM | POA: Diagnosis not present

## 2021-05-01 MED ORDER — MOLNUPIRAVIR EUA 200MG CAPSULE: 4.0000 | ORAL_CAPSULE | Freq: Two times a day (BID) | ORAL | 0 refills | Status: AC

## 2021-05-01 NOTE — Progress Notes (Signed)
Patient ID: [name], female    DOB: [birth year], 55 y.o.   MRN: [id number]  Virtual visit completed through [employer], a video enabled telemedicine application. Due to national recommendations of social distancing due to COVID-19, a virtual visit is felt to be most appropriate for this patient at this time. Reviewed limitations, risks, security and privacy concerns of performing a virtual visit and the availability of in person appointments. I also reviewed that there may be a patient responsible charge related to this service. The patient agreed to proceed.   Patient location: home Provider location: [place] [facility], office Persons participating in this virtual visit: patient, provider   If any vitals were documented, they were collected by patient at home unless specified below.    BP 129/76 Comment: per patient  Pulse 75   Temp 99.7 F (37.6 C) Comment: per patient  Wt 190 lb (86.2 kg)   BMI 30.67 kg/m    CC: Covid-19 infection Subjective:   HPI: [name] is a 55 y.o. female presenting on 05/01/2021 for Fever (Started on 04/29/21, started with heartburn sensation that resolved with medication, some joint aches when she has fever, stuffy head and nose, h/a x 2 days but not today, sore throat. Has been taking Tylenol, OTC cold and flu medication. Covid test at home had 2 lines which patient not sure if accurate on 04/30/21)  Patient tested at home and had two lines. She stated she was unsure if it was accurate as one of the lines was faint. She is not vaccinated against covid. She does not have any sick contacts that she is aware of. Has been using Dimatap and tylenol with relief.      Relevant past medical, surgical, family and social history reviewed and updated as indicated. Interim medical history since our last visit reviewed. Allergies and medications reviewed and updated. Outpatient Medications Prior to Visit  Medication Sig Dispense Refill    Cholecalciferol (VITAMIN D) 50 MCG (2000 UT) tablet Take 2,000 Units by mouth daily.     Cyanocobalamin (VITAMIN B12 PO) Take 5,000 mg by mouth daily.     diphenhydrAMINE (BENADRYL) 25 MG tablet Take 25 mg by mouth at bedtime as needed for itching.     hydrochlorothiazide (MICROZIDE) 12.5 MG capsule Take 1 capsule (12.5 mg total) by mouth daily. 90 capsule 1   Omega-3 Fatty Acids (FISH OIL) 1000 MG CAPS Take by mouth.     vitamin C (ASCORBIC ACID) 500 MG tablet Take 1,000 mg by mouth daily.     No facility-administered medications prior to visit.     Per HPI unless specifically indicated in ROS section below Review of Systems  Constitutional:  Positive for chills, fatigue and fever.  HENT:  Positive for congestion and sore throat.   Respiratory:  Negative for cough and shortness of breath.   Cardiovascular:  Negative for chest pain.  Gastrointestinal:  Negative for diarrhea, nausea and vomiting.  Musculoskeletal:  Positive for arthralgias.  Neurological:  Positive for headaches.  Objective:  BP 129/76 Comment: per patient  Pulse 75   Temp 99.7 F (37.6 C) Comment: per patient  Wt 190 lb (86.2 kg)   BMI 30.67 kg/m   Wt Readings from Last 3 Encounters:  05/01/21 190 lb (86.2 kg)  11/06/20 187 lb 12 oz (85.2 kg)  06/08/20 190 lb (86.2 kg)       Physical exam: Gen: alert, NAD, not ill appearing Pulm: speaks in complete sentences without  increased work of breathing Psych: normal mood, normal thought content      Results for orders placed or performed in visit on 11/06/20  POCT Urinalysis Dipstick (Automated)  Result Value Ref Range   Color, UA yellow    Clarity, UA cloudy    Glucose, UA Negative Negative   Bilirubin, UA negative    Ketones, UA negative    Spec Grav, UA >=1.030 (A) 1.010 - 1.025   Blood, UA negative    pH, UA 5.0 5.0 - 8.0   Protein, UA Negative Negative   Urobilinogen, UA 0.2 0.2 or 1.0 E.U./dL   Nitrite, UA negative    Leukocytes, UA Negative  Negative   Assessment & Plan:   Problem List Items Addressed This Visit       Other   COVID-19 - Primary    Patient tested positive with at-home COVID-19 test.  Patient has not vaccinated against COVID-19.  She does not have any sick contacts that she is aware of.  She does has risk factors for severe disease including current smoker and BMI of 30 or greater.  We will go ahead and treat with molnupiravir.  Gust with patient that this is emergency use authorized only discussed side effects including altered taste, GI upset, and reproductive issues.  Patient acknowledged. Continue using Dimetapp and Tylenol OTC for symptom management and start molnupiravir asap.      Relevant Medications   molnupiravir EUA (LAGEVRIO) 200 mg CAPS capsule     No orders of the defined types were placed in this encounter.  No orders of the defined types were placed in this encounter.   I discussed the assessment and treatment plan with the patient. The patient was provided an opportunity to ask questions and all were answered. The patient agreed with the plan and demonstrated an understanding of the instructions. The patient was advised to call back or seek an in-person evaluation if the symptoms worsen or if the condition fails to improve as anticipated.  Follow up plan: No follow-ups on file.  Audria Nine, NP

## 2021-05-01 NOTE — Assessment & Plan Note (Signed)
Patient tested positive with at-home COVID-19 test.  Patient has not vaccinated against COVID-19.  She does not have any sick contacts that she is aware of.  She does has risk factors for severe disease including current smoker and BMI of 30 or greater.  We will go ahead and treat with molnupiravir.  Gust with patient that this is emergency use authorized only discussed side effects including altered taste, GI upset, and reproductive issues.  Patient acknowledged. Continue using Dimetapp and Tylenol OTC for symptom management and start molnupiravir asap.

## 2021-06-02 ENCOUNTER — Other Ambulatory Visit: Payer: Self-pay | Admitting: Family Medicine

## 2021-06-02 DIAGNOSIS — I10 Essential (primary) hypertension: Secondary | ICD-10-CM

## 2021-06-06 NOTE — Telephone Encounter (Signed)
Mychart sent to pt asking if she is stil taking this.

## 2021-10-08 ENCOUNTER — Encounter: Payer: 59 | Admitting: Family Medicine

## 2021-10-24 LAB — HM PAP SMEAR: HM Pap smear: NEGATIVE

## 2021-10-24 LAB — HM MAMMOGRAPHY

## 2021-10-24 LAB — HM DEXA SCAN

## 2021-10-24 LAB — RESULTS CONSOLE HPV: CHL HPV: NEGATIVE

## 2021-11-06 ENCOUNTER — Ambulatory Visit (INDEPENDENT_AMBULATORY_CARE_PROVIDER_SITE_OTHER): Payer: 59 | Admitting: Family Medicine

## 2021-11-06 ENCOUNTER — Other Ambulatory Visit: Payer: Self-pay

## 2021-11-06 VITALS — BP 100/70 | HR 72 | Temp 97.8°F | Ht 66.0 in | Wt 192.2 lb

## 2021-11-06 DIAGNOSIS — I10 Essential (primary) hypertension: Secondary | ICD-10-CM | POA: Diagnosis not present

## 2021-11-06 DIAGNOSIS — F17200 Nicotine dependence, unspecified, uncomplicated: Secondary | ICD-10-CM

## 2021-11-06 DIAGNOSIS — E785 Hyperlipidemia, unspecified: Secondary | ICD-10-CM

## 2021-11-06 DIAGNOSIS — R5383 Other fatigue: Secondary | ICD-10-CM | POA: Diagnosis not present

## 2021-11-06 DIAGNOSIS — E559 Vitamin D deficiency, unspecified: Secondary | ICD-10-CM

## 2021-11-06 DIAGNOSIS — E538 Deficiency of other specified B group vitamins: Secondary | ICD-10-CM

## 2021-11-06 DIAGNOSIS — Z1159 Encounter for screening for other viral diseases: Secondary | ICD-10-CM

## 2021-11-06 DIAGNOSIS — Z0000 Encounter for general adult medical examination without abnormal findings: Secondary | ICD-10-CM

## 2021-11-06 DIAGNOSIS — Z1211 Encounter for screening for malignant neoplasm of colon: Secondary | ICD-10-CM

## 2021-11-06 DIAGNOSIS — R7303 Prediabetes: Secondary | ICD-10-CM | POA: Diagnosis not present

## 2021-11-06 DIAGNOSIS — Z122 Encounter for screening for malignant neoplasm of respiratory organs: Secondary | ICD-10-CM

## 2021-11-06 LAB — COMPREHENSIVE METABOLIC PANEL
ALT: 6 U/L (ref 0–35)
AST: 14 U/L (ref 0–37)
Albumin: 4.7 g/dL (ref 3.5–5.2)
Alkaline Phosphatase: 50 U/L (ref 39–117)
BUN: 11 mg/dL (ref 6–23)
CO2: 30 mEq/L (ref 19–32)
Calcium: 10.1 mg/dL (ref 8.4–10.5)
Chloride: 103 mEq/L (ref 96–112)
Creatinine, Ser: 0.67 mg/dL (ref 0.40–1.20)
GFR: 98.97 mL/min (ref >60.00)
Glucose, Bld: 91 mg/dL (ref 70–99)
Potassium: 4 mEq/L (ref 3.5–5.1)
Sodium: 140 mEq/L (ref 135–145)
Total Bilirubin: 0.7 mg/dL (ref 0.2–1.2)
Total Protein: 6.8 g/dL (ref 6.0–8.3)

## 2021-11-06 LAB — LIPID PANEL
Cholesterol: 198 mg/dL (ref 0–200)
HDL: 48.8 mg/dL (ref >39.00)
LDL Cholesterol: 123 mg/dL — ABNORMAL HIGH (ref 0–99)
NonHDL: 148.85
Total CHOL/HDL Ratio: 4
Triglycerides: 129 mg/dL (ref 0.0–149.0)
VLDL: 25.8 mg/dL (ref 0.0–40.0)

## 2021-11-06 LAB — VITAMIN B12: Vitamin B-12: 765 pg/mL (ref 211–911)

## 2021-11-06 LAB — VITAMIN D 25 HYDROXY (VIT D DEFICIENCY, FRACTURES): VITD: 38.8 ng/mL (ref 30.00–100.00)

## 2021-11-06 LAB — CBC
HCT: 41.3 % (ref 36.0–46.0)
Hemoglobin: 14.2 g/dL (ref 12.0–15.0)
MCHC: 34.4 g/dL (ref 30.0–36.0)
MCV: 92 fl (ref 78.0–100.0)
Platelets: 325 10*3/uL (ref 150.0–400.0)
RBC: 4.48 Mil/uL (ref 3.87–5.11)
RDW: 12.3 % (ref 11.5–15.5)
WBC: 7.8 10*3/uL (ref 4.0–10.5)

## 2021-11-06 LAB — FERRITIN: Ferritin: 64.4 ng/mL (ref 10.0–291.0)

## 2021-11-06 LAB — HEMOGLOBIN A1C: Hgb A1c MFr Bld: 5.7 % (ref 4.6–6.5)

## 2021-11-06 MED ORDER — HYDROCHLOROTHIAZIDE 12.5 MG PO CAPS: ORAL_CAPSULE | ORAL | 3 refills | Status: DC

## 2021-11-06 NOTE — Patient Instructions (Addendum)
Your DEXA showed osteopenia.  ? ?This means that she is at risk for developing osteoporosis and have some signs of low bone mass.  ? ?Would recommend the following:  ? ?1) 800 units of Vitamin D daily ?2) Get 1200 mg of elemental calcium --- this is best from your diet. Try to track how much calcium you get on a typical day. You could find ways to add more (dairy products, leafy greens). Take a supplement for whatever you don't typically get so you reach 1200 mg of calcium.  ?3) Physical activity (ideally weight bearing) - like walking briskly 30 minutes 5 days a week.  ? ? ?Consider getting shingles vaccine - at the pharmacy or with nurse visit here ? ?#Referral ?I have placed a referral to a specialist for you. You should receive a phone call from the specialty office. Make sure your voicemail is not full and that if you are able to answer your phone to unknown or new numbers.  ? ?It may take up to 2 weeks to hear about the referral. If you do not hear anything in 2 weeks, please call our office and ask to speak with the referral coordinator.  ? ?

## 2021-11-06 NOTE — Progress Notes (Signed)
Annual Exam  ? ?Chief Complaint:  ?Chief Complaint  ?Patient presents with  ? Annual Exam  ?  No concerns. ?Req. [facility] from [facility]   ? ? ?History of Present Illness:  ?[name] is a 56 y.o. G2P2 who LMP was No LMP recorded. Patient has had an ablation., presents today for her annual examination.   ? ?#HTN ?- continues to take the hctz ?- was also placed on this for swelling ?- no lightheadedness, dizziness ? ? ?Nutrition ?She does not get adequate calcium and Vitamin D in her diet. ?Diet: generally healthy, does not do dairy ?Exercise: not currently ? ? ? ?Social History  ? ?Tobacco Use  ?Smoking Status Every Day  ? Packs/day: 1.00  ? Years: 25.00  ? Pack years: 25.00  ? Types: Cigarettes  ?Smokeless Tobacco Never  ? ?Social History  ? ?Substance and Sexual Activity  ?Alcohol Use No  ? Alcohol/week: 0.0 standard drinks  ? ?Social History  ? ?Substance and Sexual Activity  ?Drug Use No  ? ? ? ?General Health ?Dentist in the last year: Yes ?Eye doctor: no ? ?Safety ?The patient wears seatbelts: yes.     ?The patient feels safe at home and in their relationships: yes. ? ? ?Menstrual:  ?Symptoms of menopause: none ? ?GYN ?She is single partner, contraception - post menopausal status.  ? ? ?Cervical Cancer Screening (21-65):   ?Last Pap:  09/2021 Results were: no abnormalities /neg HPV DNA  ? ?Breast Cancer Screening (Age 62-74):  ?There is no FH of breast cancer. There is no FH of ovarian cancer. BRCA screening Not Indicated.  ?Last Mammogram: 09/2021 ?The patient does want a mammogram this year.  ? ? ?Colon Cancer Screening:  ?Age 80-75 yo - benefits outweigh the risk. Adults 38-85 yo who have never been screened benefit.  ?Benefits: 134000 people in 2016 will be diagnosed and 49,000 will die - early detection helps ?[place]ms: Complications 2/2 to colonoscopy ?High Risk (Colonoscopy): genetic disorder (Lynch syndrome or familial adenomatous polyposis), personal hx of IBD, previous adenomatous  polyp, or previous colorectal cancer, FamHx start 10 years before the age at diagnosis, increased in males and black race ? ?Options:  ?FIT - looks for hemoglobin (blood in the stool) - specific and fairly sensitive - must be done annually ?Cologuard - looks for DNA and blood - more sensitive - therefore can have more false positives, every 3 years ?Colonoscopy - every 10 years if normal - sedation, bowl prep, must have someone drive you ? ?Shared decision making and the patient had decided to do cologuard. ? ? ?Social History  ? ?Tobacco Use  ?Smoking Status Every Day  ? Packs/day: 1.00  ? Years: 25.00  ? Pack years: 25.00  ? Types: Cigarettes  ?Smokeless Tobacco Never  ? ? ?Lung Cancer Screening (Ages 78-80): yes ?20 year pack history? Yes ?Current Tobacco user? Yes ?Quit less than 15 years ago? Yes ?Interested in low dose CT for lung cancer screening? yes ? ?Weight ?Wt Readings from Last 3 Encounters:  ?11/06/21 192 lb 4 oz (87.2 kg)  ?05/01/21 190 lb (86.2 kg)  ?11/06/20 187 lb 12 oz (85.2 kg)  ? ?Patient has high BMI  ?BMI Readings from Last 1 Encounters:  ?11/06/21 31.03 kg/m?  ? ? ? ?Chronic disease screening ?Blood pressure monitoring:  ?BP Readings from Last 3 Encounters:  ?11/06/21 100/70  ?05/01/21 129/76  ?11/06/20 110/70  ? ? ?Lipid Monitoring: Indication for screening: age >99, obesity,  diabetes, family hx, CV risk factors.  ?Lipid screening: Yes ? ?Lab Results  ?Component Value Date  ? CHOL 187 06/08/2020  ? HDL 37.60 (L) 06/08/2020  ? LDLCALC 122 (H) 06/08/2020  ? TRIG 136.0 06/08/2020  ? CHOLHDL 5 06/08/2020  ? ? ? ?Diabetes Screening: age >22, overweight, family hx, PCOS, hx of gestational diabetes, at risk ethnicity ?Diabetes Screening screening: Yes ? ?Lab Results  ?Component Value Date  ? HGBA1C 5.9 06/08/2020  ? ? ? ?Past Medical History:  ?Diagnosis Date  ? Abnormal uterine bleeding   ? Anemia   ? Arthritis   ? Arthritis   ? HTN (hypertension)   ? Obesity   ? Obesity   ? Smoker   ? Stress  fracture   ? SVT (supraventricular tachycardia) ([place])   ? SVT (supraventricular tachycardia) ([place])   ? Vitamin D deficiency   ? ? ?Past Surgical History:  ?Procedure Laterality Date  ? APPENDECTOMY    ? CESAREAN SECTION    ? x 2  ? DERMOID CYST  EXCISION    ? hydrothermal ablation    ? uteral and cyst removal  ? HYSTEROSCOPY    ? KNEE SURGERY    ? right x 2  ? LEFT OOPHORECTOMY    ? OVARIAN CYST SURGERY    ? left  ? SALPINGECTOMY    ? right; adhesions  ? TUBAL LIGATION    ? ? ?Prior to Admission medications   ?Medication Sig Start Date End Date Taking? Authorizing Provider  ?Cholecalciferol (VITAMIN D) 50 MCG (2000 UT) tablet Take 2,000 Units by mouth daily.   Yes [provider]  ?Cyanocobalamin (VITAMIN B12 PO) Take 5,000 mg by mouth daily.   Yes [provider]  ?diphenhydrAMINE (BENADRYL) 25 MG tablet Take 25 mg by mouth at bedtime as needed for itching.   Yes [provider]  ?hydrochlorothiazide (MICROZIDE) 12.5 MG capsule TAKE 1 CAPSULE BY MOUTH EVERY DAY 06/06/21  Yes Lesleigh Noe, MD  ?Omega-3 Fatty Acids (FISH OIL) 1000 MG CAPS Take by mouth.   Yes [provider]  ?vitamin C (ASCORBIC ACID) 500 MG tablet Take 1,000 mg by mouth daily.   Yes [provider]  ? ? ?Allergies  ?Allergen Reactions  ? Codeine   ? Latex   ? ? ?Gynecologic History: No LMP recorded. Patient has had an ablation. ? ?Obstetric History: G2P2 ? ?Social History  ? ?Socioeconomic History  ? Marital status: Married  ?  Spouse name: Marguerite Olea  ? Number of children: 2  ? Years of education: some college  ? Highest education level: Not on file  ?Occupational History  ? Occupation: ADMIN ASST  ?  Employer: [employer]  ?Tobacco Use  ? Smoking status: Every Day  ?  Packs/day: 1.00  ?  Years: 25.00  ?  Pack years: 25.00  ?  Types: Cigarettes  ? Smokeless tobacco: Never  ?Vaping Use  ? Vaping Use: Never used  ?Substance and Sexual Activity  ? Alcohol use: No  ?  Alcohol/week: 0.0 standard drinks   ? Drug use: No  ? Sexual activity: Yes  ?  Partners: Male  ?  Birth control/protection: Surgical  ?Other Topics Concern  ? Not on file  ?Social History Narrative  ? 11/06/20  ? From: the area  ? Living: with Darrell [phone])  ? Work: Web designer  ?   ? Family: 2 children - Edison Nasuti and Delcie Roch - expecting first grandchild  ?   ?  Enjoys: travel, go to [employer], hiking  ?   ? Exercise: not currently  ? Diet: salad, veggies, meat, fruit - overall tries to eat healthy  ?   ? Safety  ? Seat belts: Yes   ? Guns: No  ? Safe in relationships: Yes   ? ?Social Determinants of Health  ? ?Financial Resource Strain: Not on file  ?Food Insecurity: Not on file  ?Transportation Needs: Not on file  ?Physical Activity: Not on file  ?Stress: Not on file  ?Social Connections: Not on file  ?Intimate Partner Violence: Not on file  ? ? ?Family History  ?Problem Relation Age of Onset  ? Diabetes Brother   ? Seizures Brother   ? Cancer Maternal Grandmother   ?      gallbladder  ? Stroke Paternal Grandfather   ? Stroke Brother 66  ? Colon polyps Mother   ? Heart disease Father   ? Diabetes Father   ? Colon cancer Neg Hx   ? ? ?Review of Systems  ?Constitutional:  Negative for chills and fever.  ?HENT:  Negative for congestion and sore throat.   ?Eyes:  Negative for blurred vision and double vision.  ?Respiratory:  Negative for shortness of breath.   ?Cardiovascular:  Negative for chest pain.  ?Gastrointestinal:  Negative for heartburn, nausea and vomiting.  ?Genitourinary: Negative.   ?Musculoskeletal: Negative.  Negative for myalgias.  ?Skin:  Negative for rash.  ?Neurological:  Negative for dizziness and headaches.  ?Endo/Heme/Allergies:  Does not bruise/bleed easily.  ?Psychiatric/Behavioral:  Negative for depression. The patient is not nervous/anxious.    ? ?Physical Exam ?BP 100/70   Pulse 72   Temp 97.8 ?F (36.6 ?C) (Oral)   Ht _0  (1.676 m)   Wt 192 lb 4 oz (87.2 kg)   SpO2 97%   BMI 31.03 kg/m?   ? ?BP Readings from  Last 3 Encounters:  ?11/06/21 100/70  ?05/01/21 129/76  ?11/06/20 110/70  ? ? ? ? ?Physical Exam ?Constitutional:   ?   General: She is not in acute distress. ?   Appearance: She is well-developed. She is not diaph

## 2021-11-06 NOTE — Assessment & Plan Note (Signed)
Controlled. Cont hctz 12.5 mg ?

## 2021-11-07 LAB — HEPATITIS C ANTIBODY
Hepatitis C Ab: NONREACTIVE
SIGNAL TO CUT-OFF: <0.02 (ref <1.00)

## 2021-11-19 ENCOUNTER — Encounter: Payer: Self-pay | Admitting: Family Medicine

## 2021-11-19 DIAGNOSIS — M8580 Other specified disorders of bone density and structure, unspecified site: Secondary | ICD-10-CM | POA: Insufficient documentation

## 2021-11-20 ENCOUNTER — Encounter: Payer: Self-pay | Admitting: Family Medicine

## 2021-12-19 ENCOUNTER — Other Ambulatory Visit: Payer: Self-pay

## 2021-12-19 DIAGNOSIS — Z122 Encounter for screening for malignant neoplasm of respiratory organs: Secondary | ICD-10-CM

## 2021-12-19 DIAGNOSIS — Z87891 Personal history of nicotine dependence: Secondary | ICD-10-CM

## 2021-12-19 DIAGNOSIS — F17210 Nicotine dependence, cigarettes, uncomplicated: Secondary | ICD-10-CM

## 2022-01-01 ENCOUNTER — Ambulatory Visit (INDEPENDENT_AMBULATORY_CARE_PROVIDER_SITE_OTHER): Payer: 59 | Admitting: Acute Care

## 2022-01-01 ENCOUNTER — Encounter: Payer: Self-pay | Admitting: Acute Care

## 2022-01-01 DIAGNOSIS — F17210 Nicotine dependence, cigarettes, uncomplicated: Secondary | ICD-10-CM | POA: Diagnosis not present

## 2022-01-01 NOTE — Patient Instructions (Signed)
Thank you for participating in the [facility]. It was our pleasure to meet you today. We will call you with the results of your scan within the next few days. Your scan will be assigned a Lung RADS category score by the physicians reading the scans.  This Lung RADS score determines follow up scanning.  See below for description of categories, and follow up screening recommendations. We will be in touch to schedule your follow up screening annually or based on recommendations of our providers. We will fax a copy of your scan results to your Primary Care Physician, or the physician who referred you to the program, to ensure they have the results. Please call the office if you have any questions or concerns regarding your scanning experience or results.  Our office number is [phone]. Please speak with Denise Phelps, RN. , or  Denise Buckner RN, They are  our Lung Cancer Screening RN.'s If They are unavailable when you call, Please leave a message on the voice mail. We will return your call at our earliest convenience.This voice mail is monitored several times a day.  Remember, if your scan is normal, we will scan you annually as long as you continue to meet the criteria for the program. (Age 55-77, Current smoker or smoker who has quit within the last 15 years). If you are a smoker, remember, quitting is the single most powerful action that you can take to decrease your risk of lung cancer and other pulmonary, breathing related problems. We know quitting is hard, and we are here to help.  Please let us know if there is anything we can do to help you meet your goal of quitting. If you are a former smoker, congratulations. We are proud of you! Remain smoke free! Remember you can refer friends or family members through the number above.  We will screen them to make sure they meet criteria for the program. Thank you for helping us take better care of you by  participating in Lung Screening.  You can receive free nicotine replacement therapy ( patches, gum or mints) by calling 1-800-QUIT NOW. Please call so we can get you on the path to becoming  a non-smoker. I know it is hard, but you can do this!  Lung RADS Categories:  Lung RADS 1: no nodules or definitely non-concerning nodules.  Recommendation is for a repeat annual scan in 12 months.  Lung RADS 2:  nodules that are non-concerning in appearance and behavior with a very low likelihood of becoming an active cancer. Recommendation is for a repeat annual scan in 12 months.  Lung RADS 3: nodules that are probably non-concerning , includes nodules with a low likelihood of becoming an active cancer.  Recommendation is for a 6-month repeat screening scan. Often noted after an upper respiratory illness. We will be in touch to make sure you have no questions, and to schedule your 6-month scan.  Lung RADS 4 A: nodules with concerning findings, recommendation is most often for a follow up scan in 3 months or additional testing based on our provider's assessment of the scan. We will be in touch to make sure you have no questions and to schedule the recommended 3 month follow up scan.  Lung RADS 4 B:  indicates findings that are concerning. We will be in touch with you to schedule additional diagnostic testing based on our provider's  assessment of the scan.  Other options for assistance in smoking cessation (   As covered by your insurance benefits)  Hypnosis for smoking cessation  [employer] [phone]  Acupuncture for smoking cessation  [facility] [phone]   

## 2022-01-01 NOTE — Progress Notes (Addendum)
Virtual Visit via Telephone Note  I connected with [name] on 01/01/22 at  9:00 AM EDT by telephone and verified that I am speaking with the correct person using two identifiers.  Location: Patient: At home Provider: [address], [place], [place], [address]    I discussed the limitations, risks, security and privacy concerns of performing an evaluation and management service by telephone and the availability of in person appointments. I also discussed with the patient that there may be a patient responsible charge related to this service. The patient expressed understanding and agreed to proceed.   Shared Decision Making Visit Lung Cancer Screening Program [phone])   Eligibility: Age 56 y.o. Pack Years Smoking History Calculation 36 pack year smoking history (# packs/per year x # years smoked) Recent History of coughing up blood  no Unexplained weight loss? no ( >Than 15 pounds within the last 6 months ) Prior History Lung / other cancer no (Diagnosis within the last 5 years already requiring surveillance chest CT Scans). Smoking Status Current Smoker Former Smokers: Years since quit:  NA  Quit Date:  NA  Visit Components: Discussion included one or more decision making aids. yes Discussion included risk/benefits of screening. yes Discussion included potential follow up diagnostic testing for abnormal scans. yes Discussion included meaning and risk of over diagnosis. yes Discussion included meaning and risk of False Positives. yes Discussion included meaning of total radiation exposure. yes  Counseling Included: Importance of adherence to annual lung cancer LDCT screening. yes Impact of comorbidities on ability to participate in the program. yes Ability and willingness to under diagnostic treatment. yes  Smoking Cessation Counseling: Current Smokers:  Discussed importance of smoking cessation. yes Information about tobacco cessation classes and interventions  provided to patient. yes Patient provided with "ticket" for LDCT Scan. yes Symptomatic Patient. no  Counseling NA Diagnosis Code: Tobacco Use Z72.0 Asymptomatic Patient yes  Counseling (Intermediate counseling: > three minutes counseling) V0350 Former Smokers:  Discussed the importance of maintaining cigarette abstinence. yes Diagnosis Code: Personal History of Nicotine Dependence. K93.818 Information about tobacco cessation classes and interventions provided to patient. Yes Patient provided with "ticket" for LDCT Scan. yes Written Order for Lung Cancer Screening with LDCT placed in Epic. Yes (CT Chest Lung Cancer Screening Low Dose W/O CM) [id number] Z12.2-Screening of respiratory organs Z87.891-Personal history of nicotine dependence  I have spent 25 minutes of face to face/ virtual visit   time with  [name] discussing the risks and benefits of lung cancer screening. We viewed / discussed a power point together that explained in detail the above noted topics. We paused at intervals to allow for questions to be asked and answered to ensure understanding.We discussed that the single most powerful action that she can take to decrease her risk of developing lung cancer is to quit smoking. We discussed whether or not she is ready to commit to setting a quit date. We discussed options for tools to aid in quitting smoking including nicotine replacement therapy, non-nicotine medications, support groups, Quit Smart classes, and behavior modification. We discussed that often times setting smaller, more achievable goals, such as eliminating 1 cigarette a day for a week and then 2 cigarettes a day for a week can be helpful in slowly decreasing the number of cigarettes smoked. This allows for a sense of accomplishment as well as providing a clinical benefit. I provided  her  with smoking cessation  information  with contact information for community resources, classes, free  nicotine replacement therapy, and  access to mobile apps, text messaging, and on-line smoking cessation help. I have also provided  her  the office contact information in the event she needs to contact me, or the screening staff. We discussed the time and location of the scan, and that either Sonya Miyamoto RN, Sonya Lemon, RN  or I will call / send a letter with the results within 24-72 hours of receiving them. The patient verbalized understanding of all of  the above and had no further questions upon leaving the office. They have my contact information in the event they have any further questions.  I spent 4 minutes counseling on smoking cessation and the health risks of continued tobacco abuse.  I explained to the patient that there has been a high incidence of coronary artery disease noted on these exams. I explained that this is a non-gated exam therefore degree or severity cannot be determined. This patient is not on statin therapy. I have asked the patient to follow-up with their PCP regarding any incidental finding of coronary artery disease and management with diet or medication as their PCP  feels is clinically indicated. The patient verbalized understanding of the above and had no further questions upon completion of the visit.      Bevelyn Ngo, NP 01/01/2022

## 2022-01-02 ENCOUNTER — Ambulatory Visit (INDEPENDENT_AMBULATORY_CARE_PROVIDER_SITE_OTHER)
Admission: RE | Admit: 2022-01-02 | Discharge: 2022-01-02 | Disposition: A | Discharge status: Home / Self Care | Payer: 59 | Source: Ambulatory Visit | Attending: Cardiology | Admitting: Cardiology

## 2022-01-02 DIAGNOSIS — Z87891 Personal history of nicotine dependence: Secondary | ICD-10-CM | POA: Diagnosis not present

## 2022-01-02 DIAGNOSIS — F17210 Nicotine dependence, cigarettes, uncomplicated: Secondary | ICD-10-CM

## 2022-01-02 DIAGNOSIS — Z122 Encounter for screening for malignant neoplasm of respiratory organs: Secondary | ICD-10-CM

## 2022-01-07 ENCOUNTER — Other Ambulatory Visit: Payer: Self-pay | Admitting: Acute Care

## 2022-01-07 ENCOUNTER — Encounter: Payer: Self-pay | Admitting: Family Medicine

## 2022-01-07 DIAGNOSIS — Z87891 Personal history of nicotine dependence: Secondary | ICD-10-CM

## 2022-01-07 DIAGNOSIS — J439 Emphysema, unspecified: Secondary | ICD-10-CM | POA: Insufficient documentation

## 2022-01-07 DIAGNOSIS — I700 Atherosclerosis of aorta: Secondary | ICD-10-CM | POA: Insufficient documentation

## 2022-01-07 DIAGNOSIS — Z122 Encounter for screening for malignant neoplasm of respiratory organs: Secondary | ICD-10-CM

## 2022-01-07 DIAGNOSIS — F17210 Nicotine dependence, cigarettes, uncomplicated: Secondary | ICD-10-CM

## 2022-06-10 ENCOUNTER — Encounter (HOSPITAL_COMMUNITY): Payer: Self-pay | Admitting: *Deleted

## 2022-06-10 ENCOUNTER — Ambulatory Visit (HOSPITAL_COMMUNITY)
Admission: EM | Admit: 2022-06-10 | Discharge: 2022-06-10 | Disposition: A | Discharge status: Home / Self Care | Payer: 59 | Attending: Family Medicine | Admitting: Family Medicine

## 2022-06-10 ENCOUNTER — Ambulatory Visit (INDEPENDENT_AMBULATORY_CARE_PROVIDER_SITE_OTHER): Payer: 59

## 2022-06-10 DIAGNOSIS — W19XXXA Unspecified fall, initial encounter: Secondary | ICD-10-CM

## 2022-06-10 DIAGNOSIS — M25532 Pain in left wrist: Secondary | ICD-10-CM

## 2022-06-10 DIAGNOSIS — M79672 Pain in left foot: Secondary | ICD-10-CM

## 2022-06-10 DIAGNOSIS — M79642 Pain in left hand: Secondary | ICD-10-CM | POA: Diagnosis not present

## 2022-06-10 DIAGNOSIS — S52502A Unspecified fracture of the lower end of left radius, initial encounter for closed fracture: Secondary | ICD-10-CM | POA: Diagnosis not present

## 2022-06-10 NOTE — ED Triage Notes (Signed)
Pt states she was getting christmas decorations down and tripped over a box. She landed on her left wrist, her left toes and left knee hurts.

## 2022-06-10 NOTE — Discharge Instructions (Signed)
If not allergic, you may use over the counter ibuprofen or acetaminophen as needed. ° °

## 2022-06-11 NOTE — ED Provider Notes (Signed)
[place]   [id number] 06/10/22 Arrival Time: [place] PLAN:  1. Left wrist pain   2. Left foot pain   3. Closed fracture of distal end of left radius, unspecified fracture morphology, initial encounter    I have personally viewed the imaging studies ordered this visit. Subtle distal LEFT radius fracture. LEFT foot without appreciable fracture.  Prefers OTC analgesics.  Orders Placed This Encounter  Procedures   DG Foot Complete Left   DG Wrist Complete Left   Apply Wrist brace   Recommend:  Follow-up Information     Schedule an appointment as soon as possible for a visit  with [name].   Specialty: Orthopedic Surgery Contact information: [address] [place] [address] [phone]                 Reviewed expectations re: course of current medical issues. Questions answered. Outlined signs and symptoms indicating need for more acute intervention. Patient verbalized understanding. After Visit Summary given.  SUBJECTIVE: History from: patient. [name] is a 56 y.o. female who reports that she was getting christmas decorations down and tripped over a box falling. Now with L wrist and L foot pain mostly. Knees are sore. Normal ambulation. No tx PTA. No extremity sensation changes or weakness.  No head injury.  Past Surgical History:  Procedure Laterality Date   APPENDECTOMY     CESAREAN SECTION     x 2   DERMOID CYST  EXCISION     hydrothermal ablation     uteral and cyst removal   HYSTEROSCOPY     KNEE SURGERY     right x 2   LEFT OOPHORECTOMY     OVARIAN CYST SURGERY     left   SALPINGECTOMY     right; adhesions   TUBAL LIGATION        OBJECTIVE:  Vitals:   06/10/22 1857  BP: (!) 149/80  Pulse: 79  Resp: 18  Temp: 98.2 F (36.8 C)  TempSrc: Oral  SpO2: 97%    General appearance: alert; no distress HEENT: [place]; AT Neck: supple with FROM Resp: unlabored  respirations Extremities: LUE: warm with well perfused appearance; poorly localized moderate tenderness over left radial wrist; without gross deformities; swelling: minimal; bruising: none; wrist ROM: normal, with discomfort LLE: warm with well perfused appearance; poorly localized moderate tenderness over left distal foot; without gross deformities; swelling: minimal; bruising: none; all toes with FROM CV: brisk extremity capillary refill of LUE and LLE Skin: warm and dry; no visible rashes Neurologic: gait normal but favors L foot; normal sensation and strength of LUE and LLE Psychological: alert and cooperative; normal mood and affect  Imaging: DG Wrist Complete Left  Result Date: 06/10/2022 CLINICAL DATA:  Fall on outstretched hand EXAM: LEFT WRIST - COMPLETE 3+ VIEW COMPARISON:  None Available. FINDINGS: There is cortical irregularity noted posteriorly in the distal radius seen on lateral view. Subtle irregularity of the articular surface on the oblique view. Findings concerning for subtle nondisplaced distal left radial fracture. No ulnar abnormality. No subluxation or dislocation. IMPRESSION: Findings concerning for subtle nondisplaced distal left radial fracture. Electronically Signed   By: Rolm Baptise M.D.   On: 06/10/2022 19:59   DG Foot Complete Left  Result Date: 06/10/2022 CLINICAL DATA:  Trauma. EXAM: LEFT FOOT - COMPLETE 3+ VIEW COMPARISON:  None Available. FINDINGS: There is no evidence of fracture or dislocation. There is no evidence of arthropathy or other  focal bone abnormality. Soft tissues are unremarkable. IMPRESSION: Negative. Electronically Signed   By: Ronney Asters M.D.   On: 06/10/2022 19:58      Allergies  Allergen Reactions   Codeine    Latex     Past Medical History:  Diagnosis Date   Abnormal uterine bleeding    Anemia    Arthritis    Arthritis    HTN (hypertension)    Obesity    Obesity    Smoker    Stress fracture    SVT (supraventricular  tachycardia)    SVT (supraventricular tachycardia)    Vitamin D deficiency    Social History   Socioeconomic History   Marital status: Married    Spouse name: Darrell   Number of children: 2   Years of education: some college   Highest education level: Not on file  Occupational History   Occupation: ADMIN ASST    Employer: [employer]  Tobacco Use   Smoking status: Every Day    Packs/day: 1.00    Years: 36.00    Total pack years: 36.00    Types: Cigarettes   Smokeless tobacco: Never  Vaping Use   Vaping Use: Never used  Substance and Sexual Activity   Alcohol use: No    Alcohol/week: 0.0 standard drinks of alcohol   Drug use: No   Sexual activity: Yes    Partners: Male    Birth control/protection: Surgical  Other Topics Concern   Not on file  Social History Narrative   11/06/20   From: the area   Living: with Darrell (1992)   Work: Web designer      Family: 2 children - [name] and [name] - expecting first grandchild      Enjoys: travel, go to the beach, hiking      Exercise: not currently   Diet: salad, veggies, meat, fruit - overall tries to eat healthy      Safety   Seat belts: Yes    Guns: No   Safe in relationships: Yes    Social Determinants of Radio broadcast assistant Strain: Not on file  Food Insecurity: Not on file  Transportation Needs: Not on file  Physical Activity: Not on file  Stress: Not on file  Social Connections: Not on file   Family History  Problem Relation Age of Onset   Diabetes Brother    Seizures Brother    Cancer Maternal Grandmother         gallbladder   Stroke Paternal Grandfather    Stroke Brother 58   Colon polyps Mother    Heart disease Father    Diabetes Father    Colon cancer Neg Hx    Past Surgical History:  Procedure Laterality Date   APPENDECTOMY     CESAREAN SECTION     x 2   DERMOID CYST  EXCISION     hydrothermal ablation     uteral and cyst removal   HYSTEROSCOPY     KNEE SURGERY      right x 2   LEFT OOPHORECTOMY     OVARIAN CYST SURGERY     left   SALPINGECTOMY     right; adhesions   TUBAL LIGATION         Vanessa Kick, Mcknight 06/11/22 1313

## 2022-11-14 ENCOUNTER — Other Ambulatory Visit: Payer: Self-pay | Admitting: Family Medicine

## 2022-11-14 DIAGNOSIS — I10 Essential (primary) hypertension: Secondary | ICD-10-CM

## 2022-11-14 MED ORDER — HYDROCHLOROTHIAZIDE 12.5 MG PO CAPS: ORAL_CAPSULE | ORAL | 0 refills | Status: DC

## 2022-11-14 NOTE — Telephone Encounter (Signed)
Prescription Request  11/14/2022  LOV: 11/06/21 for annual exam   What is the name of the medication or equipment? HCTZ 12.5 mg Last refilled # 90 on 10/10/22.  Have you contacted your pharmacy to request a refill? Yes   Which pharmacy would you like this sent to?  [id number] [place], [place] - [address] [place] [address] Phone: [phone] Fax: [phone]    Patient notified that their request is being sent to the clinical staff for review and that they should receive a response within 2 business days.   Please advise at Mobile [phone] (mobile)   Pt has TOC scheduled with Hayden Pedro FNP on 02/02/2023. Sending request to Worthy Rancher FNP.

## 2022-11-14 NOTE — Telephone Encounter (Signed)
Prescription Request  11/14/2022  LOV: 11/06/2021  What is the name of the medication or equipment?  hydrochlorothiazide (MICROZIDE) 12.5 MG capsule  Have you contacted your pharmacy to request a refill? No   Which pharmacy would you like this sent to?  [id number] [place], [place] - [address] [place] [address] Phone: [phone] Fax: [phone]    Patient notified that their request is being sent to the clinical staff for review and that they should receive a response within 2 business days.   Please advise at Mobile [phone] (mobile)

## 2022-11-17 NOTE — Telephone Encounter (Signed)
Pt notified as instructed per DPR left v/m that refill for HCTZ was [facility] [address]

## 2023-01-07 ENCOUNTER — Ambulatory Visit (HOSPITAL_COMMUNITY): Payer: 59

## 2023-02-02 ENCOUNTER — Encounter: Payer: Self-pay | Admitting: Family

## 2023-02-02 ENCOUNTER — Ambulatory Visit: Payer: 59 | Admitting: Family

## 2023-02-02 VITALS — BP 130/78 | HR 74 | Temp 97.6°F | Ht 66.0 in | Wt 206.0 lb

## 2023-02-02 DIAGNOSIS — E785 Hyperlipidemia, unspecified: Secondary | ICD-10-CM | POA: Diagnosis not present

## 2023-02-02 DIAGNOSIS — E6609 Other obesity due to excess calories: Secondary | ICD-10-CM

## 2023-02-02 DIAGNOSIS — F17200 Nicotine dependence, unspecified, uncomplicated: Secondary | ICD-10-CM

## 2023-02-02 DIAGNOSIS — E538 Deficiency of other specified B group vitamins: Secondary | ICD-10-CM | POA: Diagnosis not present

## 2023-02-02 DIAGNOSIS — Z1211 Encounter for screening for malignant neoplasm of colon: Secondary | ICD-10-CM

## 2023-02-02 DIAGNOSIS — R7303 Prediabetes: Secondary | ICD-10-CM | POA: Diagnosis not present

## 2023-02-02 DIAGNOSIS — E559 Vitamin D deficiency, unspecified: Secondary | ICD-10-CM

## 2023-02-02 DIAGNOSIS — Z0000 Encounter for general adult medical examination without abnormal findings: Secondary | ICD-10-CM

## 2023-02-02 DIAGNOSIS — I10 Essential (primary) hypertension: Secondary | ICD-10-CM | POA: Diagnosis not present

## 2023-02-02 DIAGNOSIS — Z6833 Body mass index (BMI) 33.0-33.9, adult: Secondary | ICD-10-CM

## 2023-02-02 LAB — BASIC METABOLIC PANEL
BUN: 11 mg/dL (ref 6–23)
CO2: 30 mEq/L (ref 19–32)
Calcium: 10.1 mg/dL (ref 8.4–10.5)
Chloride: 103 mEq/L (ref 96–112)
Creatinine, Ser: 0.74 mg/dL (ref 0.40–1.20)
GFR: 90.82 mL/min (ref >60.00)
Glucose, Bld: 101 mg/dL — ABNORMAL HIGH (ref 70–99)
Potassium: 4.2 mEq/L (ref 3.5–5.1)
Sodium: 138 mEq/L (ref 135–145)

## 2023-02-02 LAB — CBC
HCT: 44.4 % (ref 36.0–46.0)
Hemoglobin: 14.9 g/dL (ref 12.0–15.0)
MCHC: 33.5 g/dL (ref 30.0–36.0)
MCV: 92.1 fl (ref 78.0–100.0)
Platelets: 306 10*3/uL (ref 150.0–400.0)
RBC: 4.82 Mil/uL (ref 3.87–5.11)
RDW: 12.6 % (ref 11.5–15.5)
WBC: 7.9 10*3/uL (ref 4.0–10.5)

## 2023-02-02 LAB — LIPID PANEL
Cholesterol: 225 mg/dL — ABNORMAL HIGH (ref 0–200)
HDL: 42.5 mg/dL (ref >39.00)
LDL Cholesterol: 148 mg/dL — ABNORMAL HIGH (ref 0–99)
NonHDL: 182.97
Total CHOL/HDL Ratio: 5
Triglycerides: 174 mg/dL — ABNORMAL HIGH (ref 0.0–149.0)
VLDL: 34.8 mg/dL (ref 0.0–40.0)

## 2023-02-02 LAB — VITAMIN B12: Vitamin B-12: 375 pg/mL (ref 211–911)

## 2023-02-02 LAB — HEMOGLOBIN A1C: Hgb A1c MFr Bld: 5.6 % (ref 4.6–6.5)

## 2023-02-02 LAB — VITAMIN D 25 HYDROXY (VIT D DEFICIENCY, FRACTURES): VITD: 45.82 ng/mL (ref 30.00–100.00)

## 2023-02-02 MED ORDER — HYDROCHLOROTHIAZIDE 12.5 MG PO CAPS: ORAL_CAPSULE | ORAL | 3 refills | Status: DC

## 2023-02-02 NOTE — Patient Instructions (Signed)
  Recommendations for both TDAP and shingles vaccine Can get as a nurse only visit and or at the pharmacy.    Stop by the lab prior to leaving today. I will notify you of your results once received.   Recommendations on keeping yourself healthy:  - Exercise at least 30-45 minutes a day, 3-4 days a week.  - Eat a low-fat diet with lots of fruits and vegetables, up to 7-9 servings per day.  - Seatbelts can save your life. Wear them always.  - Smoke detectors on every level of your home, check batteries every year.  - Eye Doctor - have an eye exam every 1-2 years  - Safe sex - if you may be exposed to STDs, use a condom.  - Alcohol -  If you drink, do it moderately, less than 2 drinks per day.  - Health Care Power of Attorney. Choose someone to speak for you if you are not able.  - Depression is common in our stressful world.If you're feeling down or losing interest in things you normally enjoy, please come in for a visit.  - Violence - If anyone is threatening or hurting you, please call immediately.  Due to recent changes in healthcare laws, you may see results of your imaging and/or laboratory studies on MyChart before I have had a chance to review them.  I understand that in some cases there may be results that are confusing or concerning to you. Please understand that not all results are received at the same time and often I may need to interpret multiple results in order to provide you with the best plan of care or course of treatment. Therefore, I ask that you please give me 2 business days to thoroughly review all your results before contacting my office for clarification. Should we see a critical lab result, you will be contacted sooner.   I will see you again in one year for your annual comprehensive exam unless otherwise stated and or with acute concerns.  It was a pleasure seeing you today! Please do not hesitate to reach out with any questions and or concerns.  Regards,   Mort Sawyers

## 2023-02-02 NOTE — Assessment & Plan Note (Signed)
Stable Continue hydrochlorothiazide 12.5 mg once daily

## 2023-02-02 NOTE — Assessment & Plan Note (Signed)
Pt advised to work on diet and exercise as tolerated  

## 2023-02-02 NOTE — Assessment & Plan Note (Signed)
Smoking cessation instruction/counseling given:  counseled patient on the dangers of tobacco use, advised patient to stop smoking, and reviewed strategies to maximize success 

## 2023-02-02 NOTE — Assessment & Plan Note (Signed)
Pt advised of the following: Work on a diabetic diet, try to incorporate exercise at least 20-30 a day for 3 days a week or more.   

## 2023-02-02 NOTE — Progress Notes (Signed)
Subjective:  Patient ID: [name], female    DOB: 02-Mar-1966  Age: 57 y.o. MRN: [id number]  Patient Care Team: Mort Sawyers, FNP as PCP - General (Family Medicine)   CC:  Chief Complaint  Patient presents with   Transitions Of Care    From Dr. Selena Batten    Annual Exam    Last one 11/06/21. Fasting     HPI [name] is a 57 y.o. female who presents today for an annual physical exam as well as here to transfer care.   Prior pcp: Dr. Selena Batten    She reports consuming a general diet. The patient does not participate in regular exercise at present. She generally feels well. She reports sleeping fairly well uses benadryl nightly, at times mind will wander. She does not have additional problems to discuss today.   Vision:Within last year Dental:Receives regular dental care STD:The patient denies history of sexually transmitted disease.  Lung Cancer Screening with low-dose Chest CT: 01/02/22, has to reschedule  - Adults age 20-80 who are current cigarette smokers or quit within the last 15 years. Must have 20 pack year history.    Mammogram: 10/24/2021 , states [facility] Langston Masker  Last pap: 10/24/21 Colonoscopy: cologuard, due, will order today.  Pmp: last period < 36 y/o   Lab Results  Component Value Date   CHOL 198 11/06/2021   HDL 48.80 11/06/2021   LDLCALC 123 (H) 11/06/2021   TRIG 129.0 11/06/2021   CHOLHDL 4 11/06/2021    Pt is without acute concerns.   Advanced Directives Patient does not have advanced directives  DEPRESSION SCREENING    02/02/2023    8:22 AM 11/06/2021    8:54 AM 11/06/2020    9:26 AM 06/08/2020    8:04 AM 04/25/2019    8:32 AM 03/29/2018    3:18 PM 03/16/2017   12:04 PM  PHQ 2/9 Scores  PHQ - 2 Score 0 0 0 0 0 0 2  PHQ- 9 Score 0      5     ROS: Negative unless specifically indicated above in HPI.    Current Outpatient Medications:    Cholecalciferol (VITAMIN D) 50 MCG (2000 UT) tablet, Take 2,000 Units by mouth  daily., Disp: , Rfl:    diphenhydrAMINE (BENADRYL) 25 MG tablet, Take 25 mg by mouth at bedtime as needed for itching., Disp: , Rfl:    Omega-3 Fatty Acids (FISH OIL) 1000 MG CAPS, Take by mouth., Disp: , Rfl:    vitamin C (ASCORBIC ACID) 500 MG tablet, Take 1,000 mg by mouth daily., Disp: , Rfl:    hydrochlorothiazide (MICROZIDE) 12.5 MG capsule, TAKE 1 CAPSULE BY MOUTH EVERY DAY, Disp: 90 capsule, Rfl: 3    Objective:    BP 130/78   Pulse 74   Temp 97.6 F (36.4 C) (Temporal)   Ht 5\' 6"  (1.676 m)   Wt 206 lb (93.4 kg)   SpO2 99%   BMI 33.25 kg/m   BP Readings from Last 3 Encounters:  02/02/23 130/78  06/10/22 (!) 149/80  11/06/21 100/70      Physical Exam Constitutional:      General: She is not in acute distress.    Appearance: Normal appearance. She is obese. She is not ill-appearing.  HENT:     Head: Normocephalic.     Right Ear: Tympanic membrane normal.     Left Ear: Tympanic membrane normal.     Nose: Nose normal.  Mouth/Throat:     Mouth: Mucous membranes are moist.  Eyes:     Extraocular Movements: Extraocular movements intact.     Pupils: Pupils are equal, round, and reactive to light.  Cardiovascular:     Rate and Rhythm: Normal rate and regular rhythm.  Pulmonary:     Effort: Pulmonary effort is normal.     Breath sounds: Normal breath sounds.  Abdominal:     General: Abdomen is flat. Bowel sounds are normal.     Palpations: Abdomen is soft.     Tenderness: There is no guarding or rebound.  Musculoskeletal:        General: Normal range of motion.     Cervical back: Normal range of motion.  Skin:    General: Skin is warm.     Capillary Refill: Capillary refill takes less than 2 seconds.  Neurological:     General: No focal deficit present.     Mental Status: She is alert.  Psychiatric:        Mood and Affect: Mood normal.        Behavior: Behavior normal.        Thought Content: Thought content normal.        Judgment: Judgment normal.           Assessment & Plan:  TOBACCO ABUSE Assessment & Plan: Smoking cessation instruction/counseling given:  counseled patient on the dangers of tobacco use, advised patient to stop smoking, and reviewed strategies to maximize success    Orders: -     Ambulatory Referral for Lung Cancer Scre  Essential hypertension Assessment & Plan: Stable Continue hydrochlorothiazide 12.5 mg once daily    Orders: -     hydroCHLOROthiazide; TAKE 1 CAPSULE BY MOUTH EVERY DAY  Dispense: 90 capsule; Refill: 3 -     CBC  Screening for colon cancer -     Cologuard  Hyperlipidemia, unspecified hyperlipidemia type -     Lipid panel -     CBC  Vitamin D deficiency -     VITAMIN D 25 Hydroxy (Vit-D Deficiency, Fractures)  Prediabetes Assessment & Plan: Pt advised of the following: Work on a diabetic diet, try to incorporate exercise at least 20-30 a day for 3 days a week or more.    Orders: -     Basic metabolic panel -     Hemoglobin A1c  Low serum vitamin B12 -     CBC -     Vitamin B12  Encounter for general adult medical examination without abnormal findings -     Cologuard -     Ambulatory Referral for Lung Cancer Scre -     Lipid panel -     Basic metabolic panel -     CBC  Class 1 obesity due to excess calories with serious comorbidity and body mass index (BMI) of 33.0 to 33.9 in adult Assessment & Plan: Pt advised to work on diet and exercise as tolerated        Follow-up: No follow-ups on file.   Mort Sawyers, FNP

## 2023-02-03 NOTE — Progress Notes (Signed)
Cholesterol is much too elevated. I do highly suggest starting a statin as we discussed in the office, would pt be willing?   B12 on the lower end if not already taking, start 1000 mcg once daily b12 over the counter.

## 2023-02-04 ENCOUNTER — Other Ambulatory Visit: Payer: Self-pay | Admitting: Family

## 2023-02-04 DIAGNOSIS — E785 Hyperlipidemia, unspecified: Secondary | ICD-10-CM

## 2023-02-18 LAB — COLOGUARD: COLOGUARD: NEGATIVE

## 2023-03-31 ENCOUNTER — Other Ambulatory Visit: Payer: Self-pay

## 2023-03-31 DIAGNOSIS — F17210 Nicotine dependence, cigarettes, uncomplicated: Secondary | ICD-10-CM

## 2023-03-31 DIAGNOSIS — Z87891 Personal history of nicotine dependence: Secondary | ICD-10-CM

## 2023-03-31 DIAGNOSIS — Z122 Encounter for screening for malignant neoplasm of respiratory organs: Secondary | ICD-10-CM

## 2023-04-28 ENCOUNTER — Ambulatory Visit
Admission: RE | Admit: 2023-04-28 | Discharge: 2023-04-28 | Disposition: A | Discharge status: Home / Self Care | Payer: 59 | Source: Ambulatory Visit | Attending: Family | Admitting: Family

## 2023-04-28 DIAGNOSIS — Z87891 Personal history of nicotine dependence: Secondary | ICD-10-CM

## 2023-04-28 DIAGNOSIS — F17210 Nicotine dependence, cigarettes, uncomplicated: Secondary | ICD-10-CM

## 2023-04-28 DIAGNOSIS — Z122 Encounter for screening for malignant neoplasm of respiratory organs: Secondary | ICD-10-CM

## 2023-05-11 ENCOUNTER — Other Ambulatory Visit: Payer: 59

## 2023-05-12 ENCOUNTER — Other Ambulatory Visit: Payer: Self-pay

## 2023-05-12 DIAGNOSIS — F17210 Nicotine dependence, cigarettes, uncomplicated: Secondary | ICD-10-CM

## 2023-05-12 DIAGNOSIS — Z122 Encounter for screening for malignant neoplasm of respiratory organs: Secondary | ICD-10-CM

## 2023-05-12 DIAGNOSIS — Z87891 Personal history of nicotine dependence: Secondary | ICD-10-CM

## 2023-06-09 ENCOUNTER — Other Ambulatory Visit: Payer: 59

## 2023-06-30 ENCOUNTER — Other Ambulatory Visit (INDEPENDENT_AMBULATORY_CARE_PROVIDER_SITE_OTHER): Payer: 59

## 2023-06-30 DIAGNOSIS — E785 Hyperlipidemia, unspecified: Secondary | ICD-10-CM

## 2023-06-30 LAB — LIPID PANEL
Cholesterol: 225 mg/dL — ABNORMAL HIGH (ref 0–200)
HDL: 45.8 mg/dL (ref >39.00)
LDL Cholesterol: 144 mg/dL — ABNORMAL HIGH (ref 0–99)
NonHDL: 179.25
Total CHOL/HDL Ratio: 5
Triglycerides: 174 mg/dL — ABNORMAL HIGH (ref 0.0–149.0)
VLDL: 34.8 mg/dL (ref 0.0–40.0)

## 2023-07-02 ENCOUNTER — Encounter: Payer: Self-pay | Admitting: Family

## 2023-07-02 DIAGNOSIS — E782 Mixed hyperlipidemia: Secondary | ICD-10-CM

## 2023-07-03 MED ORDER — ATORVASTATIN CALCIUM 10 MG PO TABS: 10.0000 mg | ORAL_TABLET | Freq: Every day | ORAL | 3 refills | Status: DC

## 2023-07-15 ENCOUNTER — Ambulatory Visit: Payer: 59 | Admitting: Family

## 2023-07-15 ENCOUNTER — Encounter: Payer: Self-pay | Admitting: Family

## 2023-07-15 ENCOUNTER — Ambulatory Visit: Payer: 59 | Attending: Family

## 2023-07-15 VITALS — BP 155/98 | HR 94 | Temp 97.5°F | Ht 66.0 in | Wt 199.2 lb

## 2023-07-15 DIAGNOSIS — R002 Palpitations: Secondary | ICD-10-CM

## 2023-07-15 DIAGNOSIS — E782 Mixed hyperlipidemia: Secondary | ICD-10-CM | POA: Diagnosis not present

## 2023-07-15 DIAGNOSIS — G4452 New daily persistent headache (NDPH): Secondary | ICD-10-CM

## 2023-07-15 DIAGNOSIS — R9431 Abnormal electrocardiogram [ECG] [EKG]: Secondary | ICD-10-CM | POA: Diagnosis not present

## 2023-07-15 DIAGNOSIS — I10 Essential (primary) hypertension: Secondary | ICD-10-CM

## 2023-07-15 LAB — CBC
HCT: 47.4 % — ABNORMAL HIGH (ref 36.0–46.0)
Hemoglobin: 15.9 g/dL — ABNORMAL HIGH (ref 12.0–15.0)
MCHC: 33.6 g/dL (ref 30.0–36.0)
MCV: 93 fL (ref 78.0–100.0)
Platelets: 338 10*3/uL (ref 150.0–400.0)
RBC: 5.1 Mil/uL (ref 3.87–5.11)
RDW: 12.7 % (ref 11.5–15.5)
WBC: 7.1 10*3/uL (ref 4.0–10.5)

## 2023-07-15 LAB — BASIC METABOLIC PANEL
BUN: 15 mg/dL (ref 6–23)
CO2: 30 meq/L (ref 19–32)
Calcium: 10.6 mg/dL — ABNORMAL HIGH (ref 8.4–10.5)
Chloride: 101 meq/L (ref 96–112)
Creatinine, Ser: 0.81 mg/dL (ref 0.40–1.20)
GFR: 81.23 mL/min (ref >60.00)
Glucose, Bld: 104 mg/dL — ABNORMAL HIGH (ref 70–99)
Potassium: 3.9 meq/L (ref 3.5–5.1)
Sodium: 139 meq/L (ref 135–145)

## 2023-07-15 LAB — SEDIMENTATION RATE: Sed Rate: 1 mm/h (ref 0–30)

## 2023-07-15 LAB — TSH: TSH: 0.69 u[IU]/mL (ref 0.35–5.50)

## 2023-07-15 NOTE — Patient Instructions (Signed)
  Ordered echocardiogram and also heart monitor.  If you do not hear anything in regards to setting up or scheduling in the next 1-2 weeks please let me know.

## 2023-07-15 NOTE — Progress Notes (Signed)
Established Patient Office Visit  Subjective:      CC:  Chief Complaint  Patient presents with   Medical Management of Chronic Issues    HPI: [name] is a 57 y.o. female presenting on 07/15/2023 for Medical Management of Chronic Issues . Hyperlipidemia: recently repeated 06/30/23 Lab Results  Component Value Date   CHOL 225 (H) 06/30/2023   HDL 45.80 06/30/2023   LDLCALC 144 (H) 06/30/2023   TRIG 174.0 (H) 06/30/2023   CHOLHDL 5 06/30/2023   Low serum B12, at 375   New complaints: Noticed a palpitation the other night while lying on the couch with her husband, and she felt it in the upper left chest, lasted for a bit and then went away. Didn't have any sob, chest heaviness, and or chest pain. She describes that it felt like it was pounding heavy. This was thanksgiving night so she had had some heavy food. Has not had repeat episode since that night. She denies sensation of heartburn. Does not typically notice any Doe.   She does report she had seen a cardiologist about ten years ago for SVT, had stress test and monitor and states that all was 'normal' does not remember being placed on any medication for this.   Does report headaches, have started since she felt the palpitations. Does not drink water very often. Very mild, dull frontal headache intermittently. Took some tylenol/ibuprofen with some relief.    Social history:  Relevant past medical, surgical, family and social history reviewed and updated as indicated. Interim medical history since our last visit reviewed.  Allergies and medications reviewed and updated.  DATA REVIEWED: CHART IN EPIC     ROS: Negative unless specifically indicated above in HPI.    Current Outpatient Medications:    atorvastatin (LIPITOR) 10 MG tablet, Take 1 tablet (10 mg total) by mouth daily., Disp: 90 tablet, Rfl: 3   Cholecalciferol (VITAMIN D) 50 MCG (2000 UT) tablet, Take 2,000 Units by mouth daily., Disp: , Rfl:     cyanocobalamin (VITAMIN B12) 1000 MCG tablet, Take 1,000 mcg by mouth daily., Disp: , Rfl:    diphenhydrAMINE (BENADRYL) 25 MG tablet, Take 25 mg by mouth at bedtime as needed for itching., Disp: , Rfl:    hydrochlorothiazide (MICROZIDE) 12.5 MG capsule, TAKE 1 CAPSULE BY MOUTH EVERY DAY, Disp: 90 capsule, Rfl: 3   Omega-3 Fatty Acids (FISH OIL) 1000 MG CAPS, Take by mouth., Disp: , Rfl:    vitamin C (ASCORBIC ACID) 500 MG tablet, Take 1,000 mg by mouth daily., Disp: , Rfl:    VITAMIN D PO, Take 1 tablet by mouth daily., Disp: , Rfl:       Objective:    BP (!) 155/98   Pulse 94   Temp (!) 97.5 F (36.4 C) (Temporal)   Ht 5\' 6"  (1.676 m)   Wt 199 lb 3.2 oz (90.4 kg)   SpO2 98%   BMI 32.15 kg/m   Wt Readings from Last 3 Encounters:  07/15/23 199 lb 3.2 oz (90.4 kg)  02/02/23 206 lb (93.4 kg)  11/06/21 192 lb 4 oz (87.2 kg)    Physical Exam Vitals reviewed.  Constitutional:      General: She is not in acute distress.    Appearance: Normal appearance. She is normal weight. She is not ill-appearing, toxic-appearing or diaphoretic.  HENT:     Head: Normocephalic.  Cardiovascular:     Rate and Rhythm: Normal rate and regular rhythm.  Pulmonary:  Effort: Pulmonary effort is normal.     Breath sounds: Normal breath sounds.  Musculoskeletal:        General: Normal range of motion.  Neurological:     General: No focal deficit present.     Mental Status: She is alert and oriented to person, place, and time. Mental status is at baseline.  Psychiatric:        Mood and Affect: Mood normal.        Behavior: Behavior normal.        Thought Content: Thought content normal.        Judgment: Judgment normal.           Assessment & Plan:  Mixed hyperlipidemia Assessment & Plan: Continue atorvastatin 10 mg nightly  Continue to work on low cholesterol diet.  Ordering LPA to r/o familial  Orders: -     Lipoprotein A (LPA)  Palpitations Assessment & Plan: EKG in office  today  Rate 80 bpm Sinus rhythm Poor r wave progression Echocardiogram ordered, pending results.  Also sent in order for zio monitor. Will consider referral to cardiology as well pending results.  Lab work ordered to r/o ddx thyroid disease/anemia/electrolyte abn  Orders: -     EKG 12-Lead -     TSH; Future -     CBC -     Basic metabolic panel -     ECHOCARDIOGRAM COMPLETE; Future -     LONG TERM MONITOR (3-14 DAYS); Future  Abnormal EKG -     ECHOCARDIOGRAM COMPLETE; Future -     LONG TERM MONITOR (3-14 DAYS); Future  New daily persistent headache -     Sedimentation rate  Essential hypertension Assessment & Plan: Elevated in office. Cont hydrochlorothiazide  Pt advised of the following:  Continue medication as prescribed. Monitor blood pressure periodically and/or when you feel symptomatic. Goal is <130/90 on average. Ensure that you have rested for 30 minutes prior to checking your blood pressure. Record your readings and bring them to your next visit if necessary.work on a low sodium diet.        Return in about 3 months (around 10/13/2023) for f/u cholesterol.  Mort Sawyers, MSN, APRN, FNP-C [facility]

## 2023-07-16 ENCOUNTER — Ambulatory Visit: Payer: 59 | Admitting: Family

## 2023-07-17 ENCOUNTER — Encounter: Payer: Self-pay | Admitting: Family

## 2023-07-17 ENCOUNTER — Ambulatory Visit (INDEPENDENT_AMBULATORY_CARE_PROVIDER_SITE_OTHER): Payer: 59

## 2023-07-17 ENCOUNTER — Other Ambulatory Visit: Payer: Self-pay | Admitting: Family

## 2023-07-17 DIAGNOSIS — R7989 Other specified abnormal findings of blood chemistry: Secondary | ICD-10-CM

## 2023-07-17 DIAGNOSIS — E538 Deficiency of other specified B group vitamins: Secondary | ICD-10-CM

## 2023-07-17 LAB — VITAMIN D 25 HYDROXY (VIT D DEFICIENCY, FRACTURES): VITD: 55.57 ng/mL (ref 30.00–100.00)

## 2023-07-17 NOTE — Telephone Encounter (Signed)
Spoke with pt over the phone and she had some questions about her most recent lab work. Pt wanted to know if the symptoms that she is having could be caused by her vitamin B12 being low. She states that she already takes OTC B12 and wanted to know if she could do monthly injections. She states that she also woke up last night and her shirt was "soaked" in sweat. Could this be caused by her B12 being low as well?

## 2023-07-17 NOTE — Progress Notes (Signed)
Can we add vitamin D? Order in

## 2023-07-18 LAB — LIPOPROTEIN A (LPA): Lipoprotein (a): 33 nmol/L (ref <75)

## 2023-07-19 DIAGNOSIS — R002 Palpitations: Secondary | ICD-10-CM | POA: Diagnosis not present

## 2023-07-19 DIAGNOSIS — R9431 Abnormal electrocardiogram [ECG] [EKG]: Secondary | ICD-10-CM

## 2023-07-19 NOTE — Addendum Note (Signed)
Addended by: Mort Sawyers on: 07/19/2023 08:18 PM   Modules accepted: Orders

## 2023-07-20 ENCOUNTER — Encounter: Payer: Self-pay | Admitting: Family

## 2023-07-20 DIAGNOSIS — R002 Palpitations: Secondary | ICD-10-CM | POA: Insufficient documentation

## 2023-07-20 DIAGNOSIS — I10 Essential (primary) hypertension: Secondary | ICD-10-CM

## 2023-07-20 MED ORDER — HYDROCHLOROTHIAZIDE 25 MG PO TABS: 25.0000 mg | ORAL_TABLET | Freq: Every day | ORAL | 3 refills | Status: DC

## 2023-07-20 NOTE — Assessment & Plan Note (Signed)
Continue atorvastatin 10 mg nightly  Continue to work on low cholesterol diet.  Ordering LPA to r/o familial

## 2023-07-20 NOTE — Assessment & Plan Note (Addendum)
Elevated in office. Cont hydrochlorothiazide  Pt advised of the following:  Continue medication as prescribed. Monitor blood pressure periodically and/or when you feel symptomatic. Goal is <130/90 on average. Ensure that you have rested for 30 minutes prior to checking your blood pressure. Record your readings and bring them to your next visit if necessary.work on a low sodium diet.

## 2023-07-20 NOTE — Assessment & Plan Note (Addendum)
EKG in office today  Rate 80 bpm Sinus rhythm Poor r wave progression Echocardiogram ordered, pending results.  Also sent in order for zio monitor. Will consider referral to cardiology as well pending results.  Lab work ordered to r/o ddx thyroid disease/anemia/electrolyte abn

## 2023-07-21 ENCOUNTER — Encounter: Payer: Self-pay | Admitting: *Deleted

## 2023-07-21 ENCOUNTER — Other Ambulatory Visit (INDEPENDENT_AMBULATORY_CARE_PROVIDER_SITE_OTHER): Payer: 59

## 2023-07-21 DIAGNOSIS — R7989 Other specified abnormal findings of blood chemistry: Secondary | ICD-10-CM | POA: Diagnosis not present

## 2023-07-21 DIAGNOSIS — E538 Deficiency of other specified B group vitamins: Secondary | ICD-10-CM | POA: Diagnosis not present

## 2023-07-21 LAB — VITAMIN B12: Vitamin B-12: 1208 pg/mL — ABNORMAL HIGH (ref 211–911)

## 2023-07-21 LAB — T3, FREE: T3, Free: 3.9 pg/mL (ref 2.3–4.2)

## 2023-07-22 LAB — PTH, INTACT AND CALCIUM
Calcium: 9.9 mg/dL (ref 8.6–10.4)
PTH: 39 pg/mL (ref 16–77)

## 2023-07-27 LAB — THYROID STIMULATING IMMUNOGLOBULIN: TSI: <89 %{baseline} (ref <140)

## 2023-08-07 ENCOUNTER — Encounter: Payer: Self-pay | Admitting: Family

## 2023-08-14 ENCOUNTER — Ambulatory Visit (HOSPITAL_COMMUNITY)
Admission: RE | Admit: 2023-08-14 | Discharge: 2023-08-14 | Disposition: A | Discharge status: Home / Self Care | Payer: 59 | Source: Ambulatory Visit | Attending: Family

## 2023-08-14 DIAGNOSIS — I471 Supraventricular tachycardia, unspecified: Secondary | ICD-10-CM | POA: Diagnosis not present

## 2023-08-14 DIAGNOSIS — R002 Palpitations: Secondary | ICD-10-CM | POA: Diagnosis present

## 2023-08-14 DIAGNOSIS — J439 Emphysema, unspecified: Secondary | ICD-10-CM | POA: Diagnosis not present

## 2023-08-14 DIAGNOSIS — R9431 Abnormal electrocardiogram [ECG] [EKG]: Secondary | ICD-10-CM | POA: Insufficient documentation

## 2023-08-14 DIAGNOSIS — I10 Essential (primary) hypertension: Secondary | ICD-10-CM | POA: Insufficient documentation

## 2023-08-14 LAB — ECHOCARDIOGRAM COMPLETE
AR max vel: 2.47 cm2
AV Area VTI: 2.5 cm2
AV Area mean vel: 2.54 cm2
AV Mean grad: 3.5 mm[Hg]
AV Peak grad: 6.9 mm[Hg]
Ao pk vel: 1.31 m/s
Area-P 1/2: 3.91 cm2
Calc EF: 69 %
S' Lateral: 3.1 cm
Single Plane A2C EF: 64.4 %
Single Plane A4C EF: 71.2 %

## 2023-08-20 ENCOUNTER — Ambulatory Visit: Payer: 59 | Admitting: Family

## 2023-08-20 ENCOUNTER — Other Ambulatory Visit: Payer: Self-pay | Admitting: Family

## 2023-08-20 ENCOUNTER — Encounter: Payer: Self-pay | Admitting: Family

## 2023-08-20 VITALS — BP 122/80 | HR 74 | Temp 98.3°F | Ht 66.0 in | Wt 192.0 lb

## 2023-08-20 DIAGNOSIS — I4729 Other ventricular tachycardia: Secondary | ICD-10-CM | POA: Insufficient documentation

## 2023-08-20 DIAGNOSIS — R9431 Abnormal electrocardiogram [ECG] [EKG]: Secondary | ICD-10-CM | POA: Insufficient documentation

## 2023-08-20 DIAGNOSIS — I10 Essential (primary) hypertension: Secondary | ICD-10-CM

## 2023-08-20 DIAGNOSIS — R7989 Other specified abnormal findings of blood chemistry: Secondary | ICD-10-CM

## 2023-08-20 DIAGNOSIS — E782 Mixed hyperlipidemia: Secondary | ICD-10-CM

## 2023-08-20 DIAGNOSIS — R002 Palpitations: Secondary | ICD-10-CM | POA: Diagnosis not present

## 2023-08-20 LAB — LIPID PANEL
Cholesterol: 173 mg/dL (ref 0–200)
HDL: 52 mg/dL (ref >39.00)
LDL Cholesterol: 105 mg/dL — ABNORMAL HIGH (ref 0–99)
NonHDL: 121.4
Total CHOL/HDL Ratio: 3
Triglycerides: 82 mg/dL (ref 0.0–149.0)
VLDL: 16.4 mg/dL (ref 0.0–40.0)

## 2023-08-20 LAB — VITAMIN B12: Vitamin B-12: 715 pg/mL (ref 211–911)

## 2023-08-20 MED ORDER — PRAVASTATIN SODIUM 10 MG PO TABS: 10.0000 mg | ORAL_TABLET | Freq: Every day | ORAL | 0 refills | Status: AC

## 2023-08-20 MED ORDER — METOPROLOL TARTRATE 25 MG PO TABS: 25.0000 mg | ORAL_TABLET | Freq: Two times a day (BID) | ORAL | 2 refills | Status: AC

## 2023-08-20 NOTE — Progress Notes (Signed)
 Established Patient Office Visit  Subjective:   Patient ID: [name], female    DOB: [birth year]  Age: 58 y.o. MRN: [id number]  CC:  Chief Complaint  Patient presents with   Medical Management of Chronic Issues    Review lab results    HPI: [name] is a 58 y.o. female presenting on 08/20/2023 for Medical Management of Chronic Issues (Review lab results)  Palpitations, still with them typically palpitation everyday. No longer drinking caffeine, not as frequent but still there at least once daily. No recent chest pain. No sob or doe. She does find the longer she feels the palpitations the more she feels increased fatigue. She has never been on a beta blocker.   Emyphysema lung: asymptomatic.   EKG last visit with poor r wave progression. Echocardiogram reassuring with only trivial TR.  Zio monitor, 16 nonsustained SVT, with longest at 19 beats, NSR with pac's. Average heart rate was 71 bpm.       ROS: Negative unless specifically indicated above in HPI.   Relevant past medical history reviewed and updated as indicated.   Allergies and medications reviewed and updated.   Current Outpatient Medications:    Cholecalciferol (VITAMIN D ) 50 MCG (2000 UT) tablet, Take 2,000 Units by mouth daily., Disp: , Rfl:    cyanocobalamin  (VITAMIN B12) 1000 MCG tablet, Take 1,000 mcg by mouth daily., Disp: , Rfl:    hydrochlorothiazide  (HYDRODIURIL ) 25 MG tablet, Take 1 tablet (25 mg total) by mouth daily., Disp: 90 tablet, Rfl: 3   metoprolol  tartrate (LOPRESSOR ) 25 MG tablet, Take 1 tablet (25 mg total) by mouth 2 (two) times daily., Disp: 60 tablet, Rfl: 2   Omega-3 Fatty Acids (FISH OIL) 1000 MG CAPS, Take by mouth., Disp: , Rfl:    pravastatin  (PRAVACHOL ) 10 MG tablet, Take 1 tablet (10 mg total) by mouth daily., Disp: 90 tablet, Rfl: 0   vitamin C (ASCORBIC ACID) 500 MG tablet, Take 1,000 mg by mouth daily., Disp: , Rfl:   Allergies  Allergen Reactions   Codeine    Latex     Lipitor [Atorvastatin ] Other (See Comments)    sweating    Objective:   BP 122/80 (BP Location: Left Arm, Patient Position: Sitting, Cuff Size: Normal)   Pulse 74   Temp 98.3 F (36.8 C) (Temporal)   Ht 5' 6 (1.676 m)   Wt 192 lb (87.1 kg)   SpO2 99%   BMI 30.99 kg/m    Physical Exam Constitutional:      General: She is not in acute distress.    Appearance: Normal appearance. She is normal weight. She is not ill-appearing, toxic-appearing or diaphoretic.  HENT:     Head: Normocephalic.  Cardiovascular:     Rate and Rhythm: Normal rate and regular rhythm.  Pulmonary:     Effort: Pulmonary effort is normal.     Breath sounds: Normal breath sounds.  Musculoskeletal:        General: Normal range of motion.  Neurological:     General: No focal deficit present.     Mental Status: She is alert and oriented to person, place, and time. Mental status is at baseline.  Psychiatric:        Mood and Affect: Mood normal.        Behavior: Behavior normal.        Thought Content: Thought content normal.        Judgment: Judgment normal.     Assessment & Plan:  Abnormal EKG  Palpitations Assessment & Plan: Ongoing reviewed echocardiogram as well as zio monitor.  Trial metoprolol  25 mg tartrate bid  Referral placed for cardiology as well, pending consult.  Decrease/avoid caffeine as able   Orders: -     Ambulatory referral to Cardiology  Non-sustained ventricular tachycardia (HCC) Assessment & Plan: Reviewed zio monitor.  Trial metoprolol  today.   Orders: -     Metoprolol  Tartrate; Take 1 tablet (25 mg total) by mouth 2 (two) times daily.  Dispense: 60 tablet; Refill: 2 -     Ambulatory referral to Cardiology  High serum vitamin B12 -     Vitamin B12  Mixed hyperlipidemia Assessment & Plan: Lipitor caused sweats  Change to pravastatin  10 mg nightly rx sent in  Repeat lipid panel today  F/u in three months  Work on low cholesterol diet and exercise as  tolerated   Orders: -     Pravastatin  Sodium; Take 1 tablet (10 mg total) by mouth daily.  Dispense: 90 tablet; Refill: 0 -     Lipid panel  Essential hypertension Assessment & Plan: Cont hydrochlorothiazide   Pt advised of the following:  Continue medication as prescribed. Monitor blood pressure periodically and/or when you feel symptomatic. Goal is <130/90 on average. Ensure that you have rested for 30 minutes prior to checking your blood pressure. Record your readings and bring them to your next visit if necessary.work on a low sodium diet.        Follow up plan: Return in about 3 months (around 11/18/2023) for f/u cholesterol.  Ginger Patrick, FNP

## 2023-08-20 NOTE — Assessment & Plan Note (Signed)
 Lipitor caused sweats  Change to pravastatin 10 mg nightly rx sent in  Repeat lipid panel today  F/u in three months  Work on low cholesterol diet and exercise as tolerated

## 2023-08-20 NOTE — Assessment & Plan Note (Signed)
 Cont hydrochlorothiazide   Pt advised of the following:  Continue medication as prescribed. Monitor blood pressure periodically and/or when you feel symptomatic. Goal is <130/90 on average. Ensure that you have rested for 30 minutes prior to checking your blood pressure. Record your readings and bring them to your next visit if necessary.work on a low sodium diet.

## 2023-08-20 NOTE — Assessment & Plan Note (Signed)
 Reviewed zio monitor.  Trial metoprolol today.

## 2023-08-20 NOTE — Assessment & Plan Note (Addendum)
 Ongoing reviewed echocardiogram as well as zio monitor.  Trial metoprolol 25 mg tartrate bid  Referral placed for cardiology as well, pending consult.  Decrease/avoid caffeine as able

## 2023-10-20 NOTE — Progress Notes (Unsigned)
 Cardiology Office Note:  .   Date:  10/21/2023  ID:  [name], DOB 12/22/1965, MRN [id number] PCP: Mort Sawyers, FNP  [facility] Providers Cardiologist:  None {   History of Present Illness: .   [name] is a 58 y.o. female is seen today for further evaluation and management of her palpitations.  Event monitor revealed 16 episodes of nonsustained supraventricular tachycardia.  The longest episode was 19 beats.  She has rare PACs and PVCs.  There is no evidence of atrial fibrillation.  She has known diagnosis of SVT for years.  Thanksgiving  of 2024, she had a prolonged episode of SVT  Admits to eating more , more caffeine   The episodes of SVT seem to be associated with fatigue Does not have OSA as far as she knows   Smokes 1/2 ppd  Ive  recommended that she stop smoking.   Echocardiogram reveals normal left ventricular systolic function with LVEF of 65 to 70%.  She has grade 1 diastolic dysfunction. 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         Risk Assessment/Calculations:             Physical Exam:   VS:  BP 138/80   Pulse 75   Ht 5\' 6"  (1.676 m)   Wt 187 lb (84.8 kg)   SpO2 99%   BMI 30.18 kg/m    Wt Readings from Last 3 Encounters:  10/21/23 187 lb (84.8 kg)  08/20/23 192 lb (87.1 kg)  07/15/23 199 lb 3.2 oz (90.4 kg)    GEN: Well nourished, well developed in no acute distress NECK: No JVD; No carotid bruits CARDIAC: RRR, no murmurs, rubs, gallops RESPIRATORY:  Clear to auscultation without rales, wheezing or rhonchi  ABDOMEN: Soft, non-tender, non-distended EXTREMITIES:  No edema; No deformity   ASSESSMENT AND PLAN: .   1.  Supraventricular  tachycardia: [name] presents for further discussion of her SVT.  She is known about her SVT for years. She typically has very brief episodes that do not bother her.  This past Thanksgiving she had a prolonged episode that occurred after she had been drinking more caffeine than usual and eating more than usual.  There was no syncope or presyncope.  No chest pain or shortness of breath.  They do seem to be associated with fatigue.  I suggested that she is probably having the SVT as a result of the fatigue and associated adrenaline surges.  She admits to not sleeping well.  She does not have any sleep apnea symptoms and her husband has never told her that she quits breathing.  I recommended that she try some melatonin.  Recommended that she can start a regular exercise program and also have recommended that she stop smoking.  We discussed possible treatment of her SVT with metoprolol.  At this time she does not think that her episodes are all that severe so she does not necessarily need treatment for this SVT.  I completely agree with [name] choice of treatment ( metoprolol 25 mg BID) if she does need treatment.  Another   alternative to this would  be propranolol 10 mg on an as-needed basis since her episodes are so rare.  2.  Hyperlipidemia: Her LDL remains mildly elevated at 105.  Her levels have been coming down as she is improving her diet.  I encouraged her to continue with diet, exercise, weight loss.  She is a long-term smoker and she agrees that she needs to stop smoking.  She has regular lung cancer screening test and has not been found to have any significant amount of coronary artery calcification.  We discussed possibly doing a coronary calcium score but at this time I do not think that it is absolutely necessary.  Will see her on an as-needed basis.        Dispo: PRN   Signed, Kristeen Miss, MD

## 2023-10-21 ENCOUNTER — Encounter: Payer: Self-pay | Admitting: Cardiovascular Disease

## 2023-10-21 ENCOUNTER — Ambulatory Visit: Payer: 59 | Attending: Cardiovascular Disease | Admitting: Cardiovascular Disease

## 2023-10-21 VITALS — BP 138/80 | HR 75 | Ht 66.0 in | Wt 187.0 lb

## 2023-10-21 DIAGNOSIS — E782 Mixed hyperlipidemia: Secondary | ICD-10-CM | POA: Diagnosis not present

## 2023-10-21 DIAGNOSIS — I471 Supraventricular tachycardia, unspecified: Secondary | ICD-10-CM

## 2023-10-21 NOTE — Patient Instructions (Signed)
  Follow-Up: At [facility], you and your health needs are our priority.  As part of our continuing mission to provide you with exceptional heart care, we have created designated Provider Care Teams.  These Care Teams include your primary Cardiologist (physician) and Advanced Practice Providers (APPs -  Physician Assistants and Nurse Practitioners) who all work together to provide you with the care you need, when you need it.   Your next appointment:   As Needed  Provider:   Delane Ginger, MD     1st Floor: - Lobby - Registration  - Pharmacy  - Lab - Cafe  2nd Floor: - PV Lab - Diagnostic Testing (echo, CT, nuclear med)  3rd Floor: - Vacant  4th Floor: - TCTS (cardiothoracic surgery) - AFib Clinic - Structural Heart Clinic - Vascular Surgery  - Vascular Ultrasound  5th Floor: - [facility] Cardiology (general and EP) - Clinical Pharmacy for coumadin, hypertension, lipid, weight-loss medications, and med management appointments    Valet parking services will be available as well.

## 2024-01-27 ENCOUNTER — Encounter: Payer: Self-pay | Admitting: General Practice

## 2024-01-27 ENCOUNTER — Telehealth: Admitting: Physician Assistant

## 2024-01-27 ENCOUNTER — Telehealth: Admitting: General Practice

## 2024-01-27 VITALS — Ht 66.0 in | Wt 183.0 lb

## 2024-01-27 DIAGNOSIS — J0140 Acute pansinusitis, unspecified: Secondary | ICD-10-CM | POA: Diagnosis not present

## 2024-01-27 DIAGNOSIS — Z5321 Procedure and treatment not carried out due to patient leaving prior to being seen by health care provider: Secondary | ICD-10-CM

## 2024-01-27 MED ORDER — FLUTICASONE PROPIONATE 50 MCG/ACT NA SUSP: 2.0000 | Freq: Every day | NASAL | 0 refills | Status: AC

## 2024-01-27 MED ORDER — AMOXICILLIN-POT CLAVULANATE 875-125 MG PO TABS: 1.0000 | ORAL_TABLET | Freq: Two times a day (BID) | ORAL | 0 refills | Status: AC

## 2024-01-27 MED ORDER — BENZONATATE 200 MG PO CAPS: 200.0000 mg | ORAL_CAPSULE | Freq: Three times a day (TID) | ORAL | 0 refills | Status: AC | PRN

## 2024-01-27 NOTE — Progress Notes (Signed)
 Patient arrived to video ahead of appointment time but left before time of visit when provider connected. Did not return despite link and provider waiting for 10 minutes from appt time. Will close encounter for now and monitor to see if she reschedules.

## 2024-01-27 NOTE — Assessment & Plan Note (Signed)
 Symptoms suggestive of acute pansinusitis.   Discussed the limitation of physical exam due to video visit.   Start Augmentin  antibiotics. Take 1 tablet by mouth twice daily for 7 days. Start Benzonatate  capsules for cough. Take 1 capsule by mouth three times daily as needed for cough. Nasal Congestion/Ear Pressure/Sinus Pressure: Try using Flonase (fluticasone) nasal spray. Instill 1 spray in each nostril twice daily.   ER precautions provided. Verbalizes understanding.  Advised to schedule in office visit if symptoms worsen or fail to improve.

## 2024-01-27 NOTE — Progress Notes (Signed)
 Virtual Visit via Video Note  I connected with [name] on 01/27/24 at  3:40 PM EDT by a video enabled telemedicine application and verified that I am speaking with the correct person using two identifiers.  Patient Location: Home Provider Location: Office/Clinic  I discussed the limitations, risks, security, and privacy concerns of performing an evaluation and management service by video and the availability of in person appointments. I also discussed with the patient that there may be a patient responsible charge related to this service. The patient expressed understanding and agreed to proceed.  Subjective: PCP: Felicita Horns, FNP  Chief Complaint  Patient presents with   head congestion    Since last Tuesday; does have a slight cough from drainage. Dose cough up phlegm and states she is a smoker. Has stuffy ears, pressure under eyes and headache. Patient has been taking otc cold medication and benadryl for sx.    HPI  [name] is a 58 year old female, patient of Felicita Horns, FNP, with past medical history of HTN, emphysema, osteopenia, obesity, tobacco abuse, HLD, prediabetes presents today for an acute visit.  Symptom onset was on 01/17/24 with head congestion.  Has post nasal drip causing her to have a productive cough with brown-green in color.  Endorses sinus pressure in frontal and maxillary bilaterally and ear fullness.   Hx of smoking. Hx of emphysema.   Has tried [employer] Childrens cold and cough and benadryl with minimal relief.  No fever, chills, nausea, vomiting, chest pain, chest tightness, wheezing, shortness of breath or difficulty breathing.   ROS: Per HPI  Current Outpatient Medications:    amoxicillin -clavulanate (AUGMENTIN ) 875-125 MG tablet, Take 1 tablet by mouth 2 (two) times daily for 7 days., Disp: 14 tablet, Rfl: 0   benzonatate  (TESSALON ) 200 MG capsule, Take 1 capsule (200 mg total) by mouth 3 (three) times daily as needed., Disp: 20  capsule, Rfl: 0   fluticasone (FLONASE) 50 MCG/ACT nasal spray, Place 2 sprays into both nostrils daily., Disp: 16 g, Rfl: 0   Cholecalciferol (VITAMIN D ) 50 MCG (2000 UT) tablet, Take 2,000 Units by mouth daily., Disp: , Rfl:    cyanocobalamin  (VITAMIN B12) 1000 MCG tablet, Take 1,000 mcg by mouth daily., Disp: , Rfl:    hydrochlorothiazide  (HYDRODIURIL ) 25 MG tablet, Take 1 tablet (25 mg total) by mouth daily., Disp: 90 tablet, Rfl: 3   metoprolol  tartrate (LOPRESSOR ) 25 MG tablet, Take 1 tablet (25 mg total) by mouth 2 (two) times daily. (Patient not taking: Reported on 10/21/2023), Disp: 60 tablet, Rfl: 2   Omega-3 Fatty Acids (FISH OIL) 1000 MG CAPS, Take by mouth., Disp: , Rfl:    pravastatin  (PRAVACHOL ) 10 MG tablet, Take 1 tablet (10 mg total) by mouth daily. (Patient not taking: Reported on 10/21/2023), Disp: 90 tablet, Rfl: 0   vitamin C (ASCORBIC ACID) 500 MG tablet, Take 1,000 mg by mouth daily., Disp: , Rfl:   Observations/Objective: Today's Vitals   01/27/24 1508  Weight: 183 lb (83 kg)  Height: 5' 6 (1.676 m)   Physical Exam Nursing note reviewed.  Constitutional:      Appearance: Normal appearance.   Eyes:     Conjunctiva/sclera: Conjunctivae normal.   Pulmonary:     Effort: Pulmonary effort is normal.   Neurological:     Mental Status: She is alert and oriented to person, place, and time.   Psychiatric:        Mood and Affect: Mood normal.  Behavior: Behavior normal.        Thought Content: Thought content normal.        Judgment: Judgment normal.     Assessment and Plan: Acute pansinusitis, recurrence not specified Assessment & Plan: Symptoms suggestive of acute pansinusitis.   Discussed the limitation of physical exam due to video visit.   Start Augmentin  antibiotics. Take 1 tablet by mouth twice daily for 7 days. Start Benzonatate  capsules for cough. Take 1 capsule by mouth three times daily as needed for cough. Nasal Congestion/Ear  Pressure/Sinus Pressure: Try using Flonase (fluticasone) nasal spray. Instill 1 spray in each nostril twice daily.   ER precautions provided. Verbalizes understanding.  Advised to schedule in office visit if symptoms worsen or fail to improve.  Orders: -     Amoxicillin -Pot Clavulanate; Take 1 tablet by mouth 2 (two) times daily for 7 days.  Dispense: 14 tablet; Refill: 0 -     Benzonatate ; Take 1 capsule (200 mg total) by mouth 3 (three) times daily as needed.  Dispense: 20 capsule; Refill: 0 -     Fluticasone Propionate; Place 2 sprays into both nostrils daily.  Dispense: 16 g; Refill: 0    Follow Up Instructions: Return if symptoms worsen or fail to improve.   I discussed the assessment and treatment plan with the patient. The patient was provided an opportunity to ask questions, and all were answered. The patient agreed with the plan and demonstrated an understanding of the instructions.   The patient was advised to call back or seek an in-person evaluation if the symptoms worsen or if the condition fails to improve as anticipated.  The above assessment and management plan was discussed with the patient. The patient verbalized understanding of and has agreed to the management plan.   Jolanda Nation, NP

## 2024-01-27 NOTE — Patient Instructions (Addendum)
 Start Augmentin  antibiotics. Take 1 tablet by mouth twice daily for 7 days.  Start Benzonatate  capsules for cough. Take 1 capsule by mouth three times daily as needed for cough.  Nasal Congestion/Ear Pressure/Sinus Pressure: Try using Flonase (fluticasone) nasal spray. Instill 1 spray in each nostril twice daily.   Rest, drink plenty of fluids and use a humidifier.   Schedule in-office visit if symptoms worsen or do not improve.   It was a pleasure to see you today!

## 2024-02-06 IMAGING — CT CT CHEST LUNG CANCER SCREENING LOW DOSE W/O CM
1 series · 10 of 10 positions shown, 13 images · non-contrast
Comparison: None Available.

CLINICAL DATA: Current smoker with 36 pack-year history



[ct lung segmentation data · axial · 0.76mm/px · z∈[-327,-327]mm · 10 of 307 frames shown]
[frame 1/307  mediastinal]
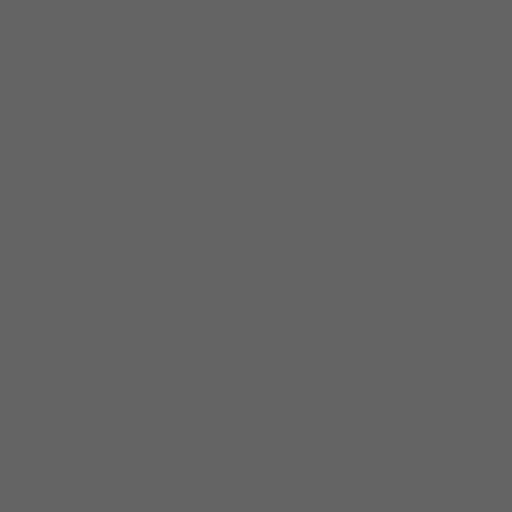
[frame 1/307  lung]
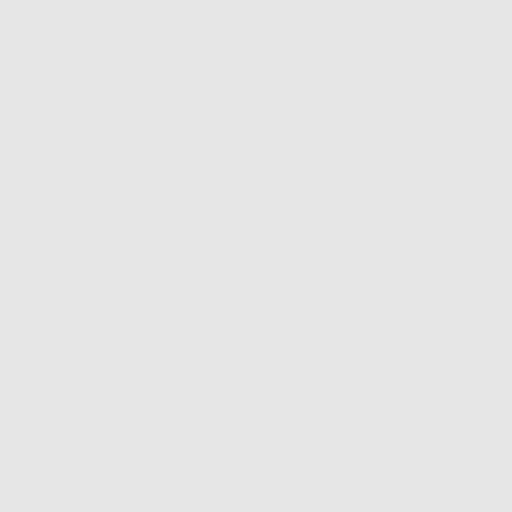
[frame 35/307  lung]
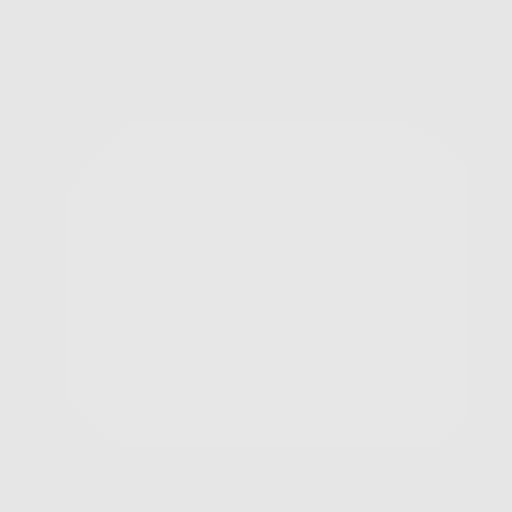
[frame 69/307  lung]
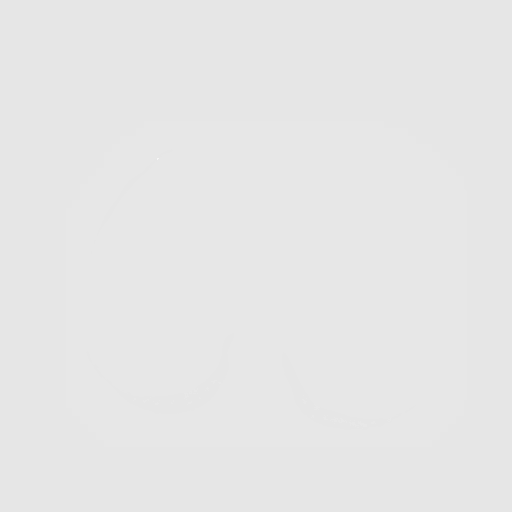
[frame 103/307  lung]
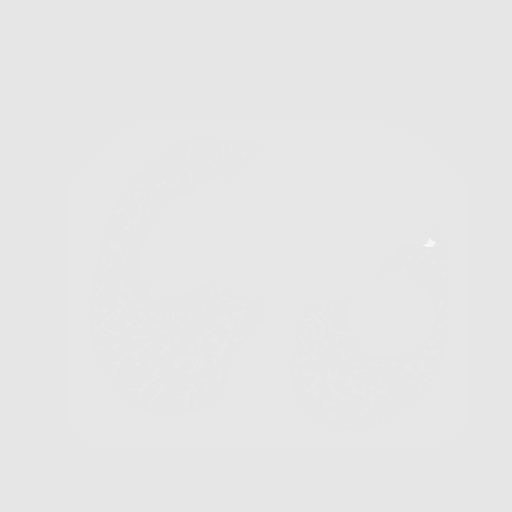
[frame 137/307  mediastinal]
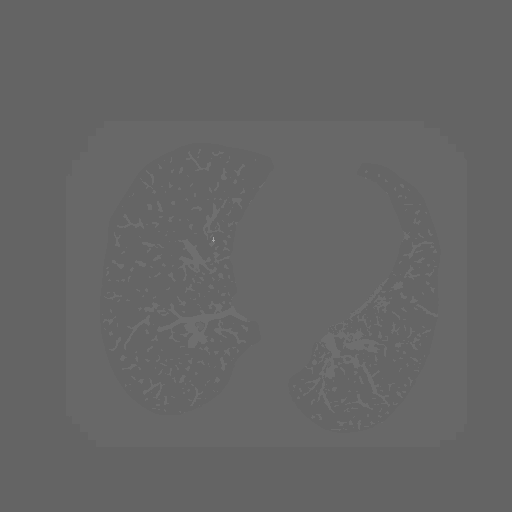
[frame 137/307  lung]
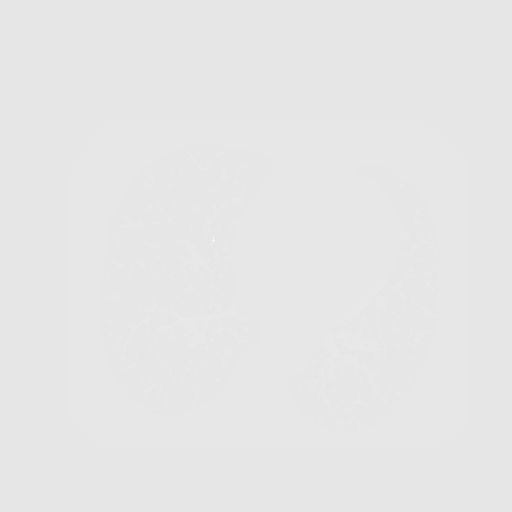
[frame 171/307  lung]
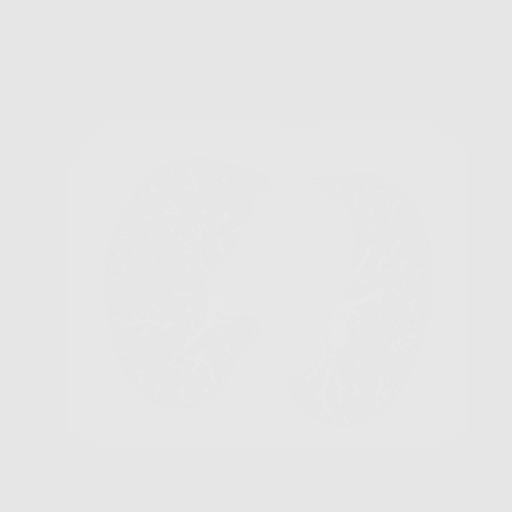
[frame 205/307  lung]
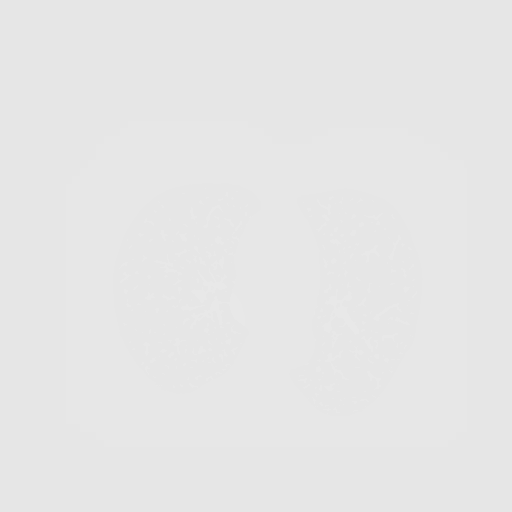
[frame 239/307  lung]
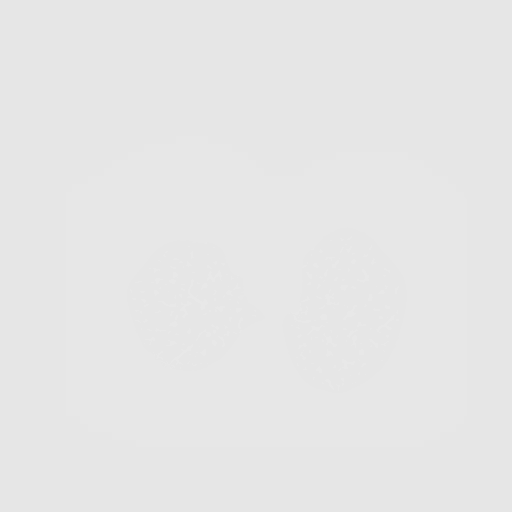
[frame 273/307  mediastinal]
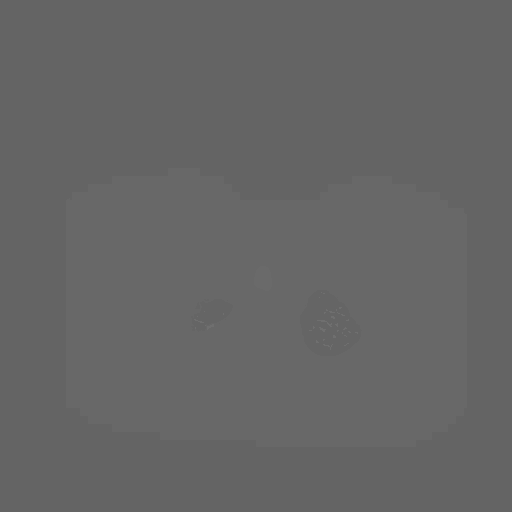
[frame 273/307  lung]
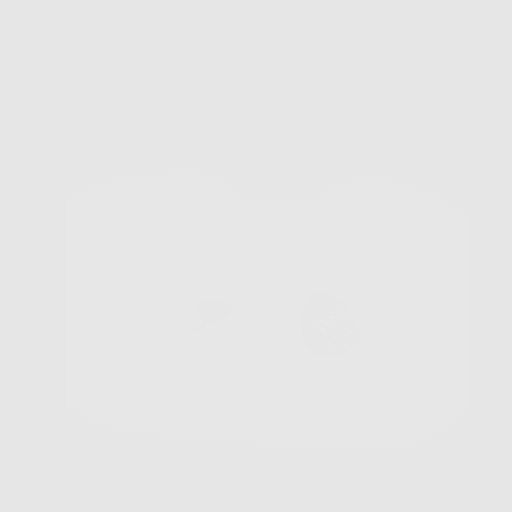
[frame 307/307  lung]
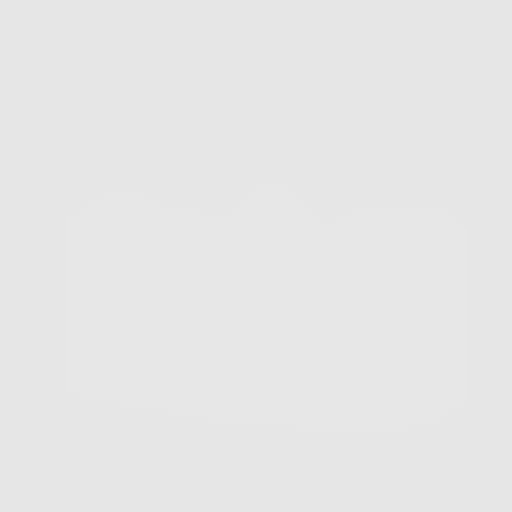

[10 of 10 positions shown; findings below may reference images not displayed]

FINDINGS: Cardiovascular: Normal heart size. No pericardial effusion. No
significant coronary artery calcifications. Mild calcified plaque of
the thoracic aorta.

Mediastinum/Nodes: Small hiatal hernia. Thyroid is unremarkable. No
pathologically enlarged lymph nodes seen in the chest.

Lungs/Pleura: Central airways are patent. Mild paraseptal emphysema.
No consolidation, pleural effusion or pneumothorax. Small bilateral
solid pulmonary nodules. Reference nodule of the lingula measuring
3.9 mm in mean diameter on image 194.

Upper Abdomen: No acute abnormality.

Musculoskeletal: No chest wall mass or suspicious bone lesions
identified.
IMPRESSION: 1. Lung-RADS 2, benign appearance or behavior. Continue annual
screening with low-dose chest CT without contrast in 12 months.
2. Aortic Atherosclerosis (ICJIS-EW2.2) and Emphysema (ICJIS-OSN.R).

## 2024-05-10 ENCOUNTER — Ambulatory Visit
Admission: RE | Admit: 2024-05-10 | Discharge: 2024-05-10 | Disposition: A | Discharge status: Home / Self Care | Source: Ambulatory Visit | Attending: Acute Care | Admitting: Acute Care

## 2024-05-10 DIAGNOSIS — Z87891 Personal history of nicotine dependence: Secondary | ICD-10-CM

## 2024-05-10 DIAGNOSIS — F17210 Nicotine dependence, cigarettes, uncomplicated: Secondary | ICD-10-CM

## 2024-05-10 DIAGNOSIS — Z122 Encounter for screening for malignant neoplasm of respiratory organs: Secondary | ICD-10-CM

## 2024-05-16 ENCOUNTER — Other Ambulatory Visit: Payer: Self-pay | Admitting: Acute Care

## 2024-05-16 DIAGNOSIS — Z87891 Personal history of nicotine dependence: Secondary | ICD-10-CM

## 2024-05-16 DIAGNOSIS — Z122 Encounter for screening for malignant neoplasm of respiratory organs: Secondary | ICD-10-CM

## 2024-05-16 DIAGNOSIS — F17210 Nicotine dependence, cigarettes, uncomplicated: Secondary | ICD-10-CM

## 2024-08-04 ENCOUNTER — Other Ambulatory Visit: Payer: Self-pay | Admitting: Family

## 2024-08-04 DIAGNOSIS — I10 Essential (primary) hypertension: Secondary | ICD-10-CM

## 2024-08-22 ENCOUNTER — Encounter: Payer: Self-pay | Admitting: Family

## 2024-12-06 ENCOUNTER — Encounter: Admitting: Family
